# Patient Record
Sex: Female | Born: 1965 | Race: White | Hispanic: No | Marital: Married | Smoking: Never smoker
Health system: Southern US, Community
[De-identification: ages and names within clinical notes are randomized; demographics above are authoritative.]

## PROBLEM LIST (undated history)

## (undated) DIAGNOSIS — I10 Essential (primary) hypertension: Secondary | ICD-10-CM

## (undated) DIAGNOSIS — R112 Nausea with vomiting, unspecified: Secondary | ICD-10-CM

## (undated) DIAGNOSIS — E119 Type 2 diabetes mellitus without complications: Secondary | ICD-10-CM

## (undated) DIAGNOSIS — Z8719 Personal history of other diseases of the digestive system: Secondary | ICD-10-CM

## (undated) DIAGNOSIS — J45909 Unspecified asthma, uncomplicated: Secondary | ICD-10-CM

## (undated) DIAGNOSIS — Z9889 Other specified postprocedural states: Secondary | ICD-10-CM

## (undated) HISTORY — PX: CATARACT EXTRACTION, BILATERAL: SHX1313

## (undated) HISTORY — PX: ABDOMINAL HYSTERECTOMY: SHX81

## (undated) HISTORY — PX: CHOLECYSTECTOMY: SHX55

## (undated) HISTORY — PX: TONSILLECTOMY: SUR1361

---

## 2013-01-25 ENCOUNTER — Emergency Department: Payer: Self-pay | Admitting: Emergency Medicine

## 2013-01-25 LAB — MAGNESIUM: Magnesium: 1.6 mg/dL — ABNORMAL LOW

## 2013-01-25 LAB — BASIC METABOLIC PANEL
Anion Gap: 0 — ABNORMAL LOW (ref 7–16)
BUN: 14 mg/dL (ref 7–18)
CALCIUM: 8.5 mg/dL (ref 8.5–10.1)
Chloride: 105 mmol/L (ref 98–107)
Co2: 31 mmol/L (ref 21–32)
Creatinine: 0.93 mg/dL (ref 0.60–1.30)
EGFR (African American): >60
GLUCOSE: 256 mg/dL — AB (ref 65–99)
OSMOLALITY: 281 (ref 275–301)
Potassium: 4.1 mmol/L (ref 3.5–5.1)
SODIUM: 136 mmol/L (ref 136–145)

## 2013-01-25 LAB — URINALYSIS, COMPLETE
Bacteria: NONE SEEN
RBC,UR: 1 /HPF (ref 0–5)
SPECIFIC GRAVITY: 1.016 (ref 1.003–1.030)
WBC UR: 1 /HPF (ref 0–5)

## 2013-01-25 LAB — CBC
HCT: 38.8 % (ref 35.0–47.0)
HGB: 13.3 g/dL (ref 12.0–16.0)
MCH: 30.6 pg (ref 26.0–34.0)
MCHC: 34.3 g/dL (ref 32.0–36.0)
MCV: 89 fL (ref 80–100)
PLATELETS: 232 10*3/uL (ref 150–440)
RBC: 4.35 10*6/uL (ref 3.80–5.20)
RDW: 13.2 % (ref 11.5–14.5)
WBC: 8.8 10*3/uL (ref 3.6–11.0)

## 2013-01-25 LAB — PROTIME-INR
INR: 0.9
PROTHROMBIN TIME: 12 s (ref 11.5–14.7)

## 2013-01-25 LAB — PRO B NATRIURETIC PEPTIDE: B-Type Natriuretic Peptide: 339 pg/mL — ABNORMAL HIGH (ref 0–125)

## 2013-01-25 LAB — TROPONIN I

## 2013-05-19 DIAGNOSIS — I10 Essential (primary) hypertension: Secondary | ICD-10-CM | POA: Diagnosis present

## 2013-06-03 ENCOUNTER — Emergency Department: Payer: Self-pay | Admitting: Emergency Medicine

## 2013-06-03 LAB — COMPREHENSIVE METABOLIC PANEL
ALK PHOS: 125 U/L — AB
ANION GAP: 8 (ref 7–16)
Albumin: 3.6 g/dL (ref 3.4–5.0)
BILIRUBIN TOTAL: 0.3 mg/dL (ref 0.2–1.0)
BUN: 21 mg/dL — AB (ref 7–18)
Calcium, Total: 9.4 mg/dL (ref 8.5–10.1)
Chloride: 95 mmol/L — ABNORMAL LOW (ref 98–107)
Co2: 26 mmol/L (ref 21–32)
Creatinine: 1.25 mg/dL (ref 0.60–1.30)
EGFR (African American): 58 — ABNORMAL LOW
GFR CALC NON AF AMER: 50 — AB
Glucose: 453 mg/dL — ABNORMAL HIGH (ref 65–99)
OSMOLALITY: 282 (ref 275–301)
Potassium: 3.9 mmol/L (ref 3.5–5.1)
SGOT(AST): 32 U/L (ref 15–37)
SGPT (ALT): 32 U/L (ref 12–78)
SODIUM: 129 mmol/L — AB (ref 136–145)
Total Protein: 8.7 g/dL — ABNORMAL HIGH (ref 6.4–8.2)

## 2013-06-03 LAB — URINALYSIS, COMPLETE
Bacteria: NONE SEEN
Bilirubin,UR: NEGATIVE
Blood: NEGATIVE
KETONE: NEGATIVE
LEUKOCYTE ESTERASE: NEGATIVE
Nitrite: NEGATIVE
Ph: 5 (ref 4.5–8.0)
Protein: NEGATIVE
RBC,UR: <1 /HPF (ref 0–5)
SPECIFIC GRAVITY: 1.032 (ref 1.003–1.030)
Squamous Epithelial: <1

## 2013-06-03 LAB — CBC
HCT: 46.2 % (ref 35.0–47.0)
HGB: 15.6 g/dL (ref 12.0–16.0)
MCH: 30.9 pg (ref 26.0–34.0)
MCHC: 33.8 g/dL (ref 32.0–36.0)
MCV: 91 fL (ref 80–100)
Platelet: 231 10*3/uL (ref 150–440)
RBC: 5.06 10*6/uL (ref 3.80–5.20)
RDW: 12 % (ref 11.5–14.5)
WBC: 8.5 10*3/uL (ref 3.6–11.0)

## 2013-06-03 LAB — TROPONIN I

## 2013-06-16 DIAGNOSIS — E039 Hypothyroidism, unspecified: Secondary | ICD-10-CM | POA: Diagnosis present

## 2013-06-23 DIAGNOSIS — E785 Hyperlipidemia, unspecified: Secondary | ICD-10-CM | POA: Diagnosis present

## 2014-01-11 ENCOUNTER — Emergency Department: Payer: Self-pay | Admitting: Emergency Medicine

## 2014-01-11 LAB — COMPREHENSIVE METABOLIC PANEL
Albumin: 3.4 g/dL (ref 3.4–5.0)
Alkaline Phosphatase: 143 U/L — ABNORMAL HIGH
Anion Gap: 7 (ref 7–16)
BUN: 12 mg/dL (ref 7–18)
Bilirubin,Total: 0.4 mg/dL (ref 0.2–1.0)
CO2: 26 mmol/L (ref 21–32)
Calcium, Total: 8.7 mg/dL (ref 8.5–10.1)
Chloride: 104 mmol/L (ref 98–107)
Creatinine: 0.93 mg/dL (ref 0.60–1.30)
Glucose: 338 mg/dL — ABNORMAL HIGH (ref 65–99)
OSMOLALITY: 287 (ref 275–301)
Potassium: 4.3 mmol/L (ref 3.5–5.1)
SGOT(AST): 21 U/L (ref 15–37)
SGPT (ALT): 27 U/L
SODIUM: 137 mmol/L (ref 136–145)
Total Protein: 7.7 g/dL (ref 6.4–8.2)

## 2014-01-11 LAB — URINALYSIS, COMPLETE
BACTERIA: NONE SEEN
BILIRUBIN, UR: NEGATIVE
BLOOD: NEGATIVE
Ketone: NEGATIVE
LEUKOCYTE ESTERASE: NEGATIVE
Nitrite: NEGATIVE
Ph: 6 (ref 4.5–8.0)
Protein: NEGATIVE
RBC,UR: 1 /HPF (ref 0–5)
Specific Gravity: 1.029 (ref 1.003–1.030)
WBC UR: 6 /HPF (ref 0–5)

## 2014-01-11 LAB — TROPONIN I: Troponin-I: <0.02 ng/mL

## 2014-01-11 LAB — CBC
HCT: 45.1 % (ref 35.0–47.0)
HGB: 14.9 g/dL (ref 12.0–16.0)
MCH: 30.3 pg (ref 26.0–34.0)
MCHC: 33 g/dL (ref 32.0–36.0)
MCV: 92 fL (ref 80–100)
PLATELETS: 231 10*3/uL (ref 150–440)
RBC: 4.92 10*6/uL (ref 3.80–5.20)
RDW: 12.6 % (ref 11.5–14.5)
WBC: 7.6 10*3/uL (ref 3.6–11.0)

## 2014-01-11 LAB — CK TOTAL AND CKMB (NOT AT ARMC)
CK, Total: 87 U/L (ref 26–192)
CK-MB: 2 ng/mL (ref 0.5–3.6)

## 2014-09-22 ENCOUNTER — Emergency Department
Admission: EM | Admit: 2014-09-22 | Discharge: 2014-09-23 | Disposition: A | Discharge status: Home / Self Care | Payer: No Typology Code available for payment source | Attending: Emergency Medicine | Admitting: Emergency Medicine

## 2014-09-22 ENCOUNTER — Encounter: Payer: Self-pay | Admitting: Emergency Medicine

## 2014-09-22 DIAGNOSIS — R1032 Left lower quadrant pain: Secondary | ICD-10-CM | POA: Insufficient documentation

## 2014-09-22 DIAGNOSIS — I10 Essential (primary) hypertension: Secondary | ICD-10-CM | POA: Diagnosis not present

## 2014-09-22 DIAGNOSIS — E1165 Type 2 diabetes mellitus with hyperglycemia: Secondary | ICD-10-CM | POA: Insufficient documentation

## 2014-09-22 DIAGNOSIS — R739 Hyperglycemia, unspecified: Secondary | ICD-10-CM

## 2014-09-22 DIAGNOSIS — E860 Dehydration: Secondary | ICD-10-CM | POA: Insufficient documentation

## 2014-09-22 HISTORY — DX: Essential (primary) hypertension: I10

## 2014-09-22 HISTORY — DX: Type 2 diabetes mellitus without complications: E11.9

## 2014-09-22 LAB — COMPREHENSIVE METABOLIC PANEL
ALBUMIN: 3.9 g/dL (ref 3.5–5.0)
ALT: 20 U/L (ref 14–54)
AST: 23 U/L (ref 15–41)
Alkaline Phosphatase: 106 U/L (ref 38–126)
Anion gap: 9 (ref 5–15)
BILIRUBIN TOTAL: 0.6 mg/dL (ref 0.3–1.2)
BUN: 16 mg/dL (ref 6–20)
CHLORIDE: 99 mmol/L — AB (ref 101–111)
CO2: 27 mmol/L (ref 22–32)
Calcium: 8.8 mg/dL — ABNORMAL LOW (ref 8.9–10.3)
Creatinine, Ser: 1.08 mg/dL — ABNORMAL HIGH (ref 0.44–1.00)
GFR calc Af Amer: >60 mL/min (ref >60)
GFR calc non Af Amer: 58 mL/min — ABNORMAL LOW (ref >60)
GLUCOSE: 424 mg/dL — AB (ref 65–99)
POTASSIUM: 3.2 mmol/L — AB (ref 3.5–5.1)
Sodium: 135 mmol/L (ref 135–145)
Total Protein: 7.9 g/dL (ref 6.5–8.1)

## 2014-09-22 LAB — URINALYSIS COMPLETE WITH MICROSCOPIC (ARMC ONLY)
Bilirubin Urine: NEGATIVE
Glucose, UA: >500 mg/dL — AB
Hgb urine dipstick: NEGATIVE
Ketones, ur: NEGATIVE mg/dL
Leukocytes, UA: NEGATIVE
Nitrite: NEGATIVE
PH: 7 (ref 5.0–8.0)
PROTEIN: NEGATIVE mg/dL
Specific Gravity, Urine: 1.025 (ref 1.005–1.030)

## 2014-09-22 LAB — CBC
HEMATOCRIT: 40.9 % (ref 35.0–47.0)
Hemoglobin: 14.1 g/dL (ref 12.0–16.0)
MCH: 30.4 pg (ref 26.0–34.0)
MCHC: 34.4 g/dL (ref 32.0–36.0)
MCV: 88.4 fL (ref 80.0–100.0)
PLATELETS: 239 10*3/uL (ref 150–440)
RBC: 4.63 MIL/uL (ref 3.80–5.20)
RDW: 12.8 % (ref 11.5–14.5)
WBC: 8.3 10*3/uL (ref 3.6–11.0)

## 2014-09-22 MED ORDER — SODIUM CHLORIDE 0.9 % IV BOLUS (SEPSIS)
1000.0000 mL | Freq: Once | INTRAVENOUS | Status: AC
  Administered 2014-09-22: 1000 mL via INTRAVENOUS

## 2014-09-22 MED ORDER — ONDANSETRON HCL 4 MG/2ML IJ SOLN
4.0000 mg | Freq: Once | INTRAMUSCULAR | Status: AC
  Administered 2014-09-22: 4 mg via INTRAVENOUS
  Filled 2014-09-22: qty 2

## 2014-09-22 MED ORDER — FENTANYL CITRATE (PF) 100 MCG/2ML IJ SOLN
50.0000 ug | Freq: Once | INTRAMUSCULAR | Status: AC
  Administered 2014-09-22: 50 ug via INTRAVENOUS
  Filled 2014-09-22: qty 2

## 2014-09-22 NOTE — ED Notes (Signed)
CBG completed. Result 401.

## 2014-09-22 NOTE — ED Provider Notes (Signed)
[facility] Emergency Department Provider Note  ____________________________________________  Time seen: 11:30 PM  I have reviewed the triage vital signs and the nursing notes.   HISTORY  Chief Complaint Hyperglycemia      HPI Jennifer Lozano is a 49 y.o. female resents with hyperglycemia glucose reading greater than 600 at home this evening. Patient states following that result she took 34 units of NovoLog 70/30. On EMS arrival of patient's glucose was 406. Patient states states only recent illness" gastroenteritis which she was seen at [facility]. Patient admits to left lower quadrant abdominal pain no vomiting or diarrhea at this time. Current pain score 8 out of 10. Patient's husband stated that she was recently prescribed a "steroid medication" however he cannot recall the name.    Past Medical History  Diagnosis Date  . Diabetes mellitus without complication   . Hypertension     There are no active problems to display for this patient.   Past Surgical History  Procedure Laterality Date  . Abdominal hysterectomy    . Cholecystectomy    . Tonsillectomy      No current outpatient prescriptions on file.  Allergies Morphine and related  Family History  Problem Relation Age of Onset  . Diabetes Sister   . Diabetes Brother     Social History Social History  Substance Use Topics  . Smoking status: Never Smoker   . Smokeless tobacco: None  . Alcohol Use: No    Review of Systems  Constitutional: Negative for fever. Eyes: Negative for visual changes. ENT: Negative for sore throat. Cardiovascular: Negative for chest pain. Respiratory: Negative for shortness of breath. Gastrointestinal: Positive for abdominal pain, negative for vomiting and diarrhea. Genitourinary: Negative for dysuria. Musculoskeletal: Negative for back pain. Skin: Negative for rash. Neurological: Negative for headaches, focal weakness or numbness.   10-point  ROS otherwise negative.  ____________________________________________   PHYSICAL EXAM:  VITAL SIGNS: ED Triage Vitals  Enc Vitals Group     BP 09/22/14 2309 160/76 mmHg     Pulse Rate 09/22/14 2309 76     Resp 09/22/14 2309 18     Temp 09/22/14 2309 98.6 F (37 C)     Temp Source 09/22/14 2309 Oral     SpO2 --      Weight 09/22/14 2309 196 lb (88.905 kg)     Height 09/22/14 2309 5\' 2"  (1.575 m)     Head Cir --      Peak Flow --      Pain Score 09/22/14 2306 8     Pain Loc --      Pain Edu? --      Excl. in GC? --     Constitutional: Alert and oriented. Well appearing and in no distress. Eyes: Conjunctivae are normal. PERRL. Normal extraocular movements. ENT   Head: Normocephalic and atraumatic.   Nose: No congestion/rhinnorhea.   Mouth/Throat: Mucous membranes are moist.   Neck: No stridor. Hematological/Lymphatic/Immunilogical: No cervical lymphadenopathy. Cardiovascular: Normal rate, regular rhythm. Normal and symmetric distal pulses are present in all extremities. No murmurs, rubs, or gallops. Respiratory: Normal respiratory effort without tachypnea nor retractions. Breath sounds are clear and equal bilaterally. No wheezes/rales/rhonchi. Gastrointestinal: Positive left lower quadrant abdominal pain to palpation. No distention. There is no CVA tenderness. Genitourinary: deferred Musculoskeletal: Nontender with normal range of motion in all extremities. No joint effusions.  No lower extremity tenderness nor edema. Neurologic:  Normal speech and language. No gross focal neurologic deficits are appreciated. Speech  is normal.  Skin:  Skin is warm, dry and intact. No rash noted. Psychiatric: Mood and affect are normal. Speech and behavior are normal. Patient exhibits appropriate insight and judgment.  ____________________________________________    LABS (pertinent positives/negatives)  Labs Reviewed  COMPREHENSIVE METABOLIC PANEL - Abnormal; Notable for the  following:    Potassium 3.2 (*)    Chloride 99 (*)    Glucose, Bld 424 (*)    Creatinine, Ser 1.08 (*)    Calcium 8.8 (*)    GFR calc non Af Amer 58 (*)    All other components within normal limits  CBC  URINALYSIS COMPLETEWITH MICROSCOPIC ([facility] ONLY)      RADIOLOGY  CT Abdomen Pelvis W Contrast (Final result) Result time: 09/23/14 01:41:05   Final result by Rad Results In Interface (09/23/14 01:41:05)   Narrative:   CLINICAL DATA: Left lower quadrant pain for 2 weeks. Nausea and vomiting.  EXAM: CT ABDOMEN AND PELVIS WITH CONTRAST  TECHNIQUE: Multidetector CT imaging of the abdomen and pelvis was performed using the standard protocol following bolus administration of intravenous contrast.  CONTRAST: OMNIPAQUE IOHEXOL 300 MG/ML SOLN  COMPARISON: None.  FINDINGS: Lower chest: The included lung bases are clear.  Liver: No focal lesion.  Hepatobiliary: Clips in the gallbladder fossa postcholecystectomy. No biliary dilatation.  Pancreas: Normal.  Spleen: Normal.  Adrenal glands: No nodule.  Kidneys: Symmetric renal enhancement and excretion. No hydronephrosis. No focal renal abnormality.  Stomach/Bowel: Stomach is distended with enteric contrast. There are no dilated or thickened small bowel loops. Small volume of stool throughout the colon without colonic wall thickening. No significant diverticular disease or diverticulitis. The appendix is normal.  Vascular/Lymphatic: No retroperitoneal adenopathy. Abdominal aorta is normal in caliber. Mild atherosclerosis of the abdominal aorta and its branches without aneurysm.  Reproductive: The uterus is surgically absent. No adnexal mass.  Bladder: Near completely decompressed.  Other: No free air, free fluid, or intra-abdominal fluid collection.  Musculoskeletal: There are no acute or suspicious osseous abnormalities.  IMPRESSION: No acute abnormality in the abdomen/pelvis. No diverticulosis  or diverticulitis.   Electronically Signed By: Rubye Oaks M.D. On: 09/23/2014 01:41         INITIAL IMPRESSION / ASSESSMENT AND PLAN / ED COURSE  Pertinent labs & imaging results that were available during my care of the patient were reviewed by me and considered in my medical decision making (see chart for details).  History of physical exam consistent with dehydration and hyperglycemia. Patient's hyperglycemia most likely secondary just recent oral steroid use. Considered possibly of infection however no foci was found. ____________________________________________   FINAL CLINICAL IMPRESSION(S) / ED DIAGNOSES  Final diagnoses:  Hyperglycemia  Dehydration      Darci Current, MD 09/23/14 [id number]

## 2014-09-22 NOTE — ED Notes (Signed)
EMS states that pt checked her BS and noted it to be over 600. Pt took 34 units of Novolog 70/30. EMS states when they arrived they checked it twice and last check was 406. Pt states she just feels weak.

## 2014-09-23 ENCOUNTER — Emergency Department: Payer: No Typology Code available for payment source

## 2014-09-23 ENCOUNTER — Encounter: Payer: Self-pay | Admitting: Radiology

## 2014-09-23 LAB — GLUCOSE, CAPILLARY
GLUCOSE-CAPILLARY: 401 mg/dL — AB (ref 65–99)
GLUCOSE-CAPILLARY: 226 mg/dL — AB (ref 65–99)
Glucose-Capillary: 242 mg/dL — ABNORMAL HIGH (ref 65–99)

## 2014-09-23 MED ORDER — IOHEXOL 300 MG/ML  SOLN
100.0000 mL | Freq: Once | INTRAMUSCULAR | Status: AC | PRN
  Administered 2014-09-23: 100 mL via INTRAVENOUS

## 2014-09-23 MED ORDER — IOHEXOL 240 MG/ML SOLN
25.0000 mL | Freq: Once | INTRAMUSCULAR | Status: AC | PRN
  Administered 2014-09-23: 25 mL via ORAL

## 2014-09-23 NOTE — Discharge Instructions (Signed)
Dehydration, Adult Dehydration is when you lose more fluids from the body than you take in. Vital organs like the kidneys, brain, and heart cannot function without a proper amount of fluids and salt. Any loss of fluids from the body can cause dehydration.  CAUSES   Vomiting.  Diarrhea.  Excessive sweating.  Excessive urine output.  Fever. SYMPTOMS  Mild dehydration  Thirst.  Dry lips.  Slightly dry mouth. Moderate dehydration  Very dry mouth.  Sunken eyes.  Skin does not bounce back quickly when lightly pinched and released.  Dark urine and decreased urine production.  Decreased tear production.  Headache. Severe dehydration  Very dry mouth.  Extreme thirst.  Rapid, weak pulse (more than 100 beats per minute at rest).  Cold hands and feet.  Not able to sweat in spite of heat and temperature.  Rapid breathing.  Blue lips.  Confusion and lethargy.  Difficulty being awakened.  Minimal urine production.  No tears. DIAGNOSIS  Your caregiver will diagnose dehydration based on your symptoms and your exam. Blood and urine tests will help confirm the diagnosis. The diagnostic evaluation should also identify the cause of dehydration. TREATMENT  Treatment of mild or moderate dehydration can often be done at home by increasing the amount of fluids that you drink. It is best to drink small amounts of fluid more often. Drinking too much at one time can make vomiting worse. Refer to the home care instructions below. Severe dehydration needs to be treated at the hospital where you will probably be given intravenous (IV) fluids that contain water and electrolytes. HOME CARE INSTRUCTIONS   Ask your caregiver about specific rehydration instructions.  Drink enough fluids to keep your urine clear or pale yellow.  Drink small amounts frequently if you have nausea and vomiting.  Eat as you normally do.  Avoid:  Foods or drinks high in sugar.  Carbonated  drinks.  Juice.  Extremely hot or cold fluids.  Drinks with caffeine.  Fatty, greasy foods.  Alcohol.  Tobacco.  Overeating.  Gelatin desserts.  Wash your hands well to avoid spreading bacteria and viruses.  Only take over-the-counter or prescription medicines for pain, discomfort, or fever as directed by your caregiver.  Ask your caregiver if you should continue all prescribed and over-the-counter medicines.  Keep all follow-up appointments with your caregiver. SEEK MEDICAL CARE IF:  You have abdominal pain and it increases or stays in one area (localizes).  You have a rash, stiff neck, or severe headache.  You are irritable, sleepy, or difficult to awaken.  You are weak, dizzy, or extremely thirsty. SEEK IMMEDIATE MEDICAL CARE IF:   You are unable to keep fluids down or you get worse despite treatment.  You have frequent episodes of vomiting or diarrhea.  You have blood or green matter (bile) in your vomit.  You have blood in your stool or your stool looks black and tarry.  You have not urinated in 6 to 8 hours, or you have only urinated a small amount of very dark urine.  You have a fever.  You faint. MAKE SURE YOU:   Understand these instructions.  Will watch your condition.  Will get help right away if you are not doing well or get worse. Document Released: 12/25/2004 Document Revised: 03/19/2011 Document Reviewed: 08/14/2010 Exit[employer] Patient Information 2015 [employer]. This information is not intended to replace advice given to you by your health care provider. Make sure you discuss any questions you have with your health care   provider.   Hyperglycemia Hyperglycemia occurs when the glucose (sugar) in your blood is too high. Hyperglycemia can happen for many reasons, but it most often happens to people who do not know they have diabetes or are not managing their diabetes properly.  CAUSES  Whether you have diabetes or not, there are other  causes of hyperglycemia. Hyperglycemia can occur when you have diabetes, but it can also occur in other situations that you might not be as aware of, such as: Diabetes  If you have diabetes and are having problems controlling your blood glucose, hyperglycemia could occur because of some of the following reasons:  Not following your meal plan.  Not taking your diabetes medications or not taking it properly.  Exercising less or doing less activity than you normally do.  Being sick. Pre-diabetes  This cannot be ignored. Before people develop Type 2 diabetes, they almost always have "pre-diabetes." This is when your blood glucose levels are higher than normal, but not yet high enough to be diagnosed as diabetes. Research has shown that some long-term damage to the body, especially the heart and circulatory system, may already be occurring during pre-diabetes. If you take action to manage your blood glucose when you have pre-diabetes, you may delay or prevent Type 2 diabetes from developing. Stress  If you have diabetes, you may be "diet" controlled or on oral medications or insulin to control your diabetes. However, you may find that your blood glucose is higher than usual in the hospital whether you have diabetes or not. This is often referred to as "stress hyperglycemia." Stress can elevate your blood glucose. This happens because of hormones put out by the body during times of stress. If stress has been the cause of your high blood glucose, it can be followed regularly by your caregiver. That way he/she can make sure your hyperglycemia does not continue to get worse or progress to diabetes. Steroids  Steroids are medications that act on the infection fighting system (immune system) to block inflammation or infection. One side effect can be a rise in blood glucose. Most people can produce enough extra insulin to allow for this rise, but for those who cannot, steroids make blood glucose levels go  even higher. It is not unusual for steroid treatments to "uncover" diabetes that is developing. It is not always possible to determine if the hyperglycemia will go away after the steroids are stopped. A special blood test called an A1c is sometimes done to determine if your blood glucose was elevated before the steroids were started. SYMPTOMS  Thirsty.  Frequent urination.  Dry mouth.  Blurred vision.  Tired or fatigue.  Weakness.  Sleepy.  Tingling in feet or leg. DIAGNOSIS  Diagnosis is made by monitoring blood glucose in one or all of the following ways:  A1c test. This is a chemical found in your blood.  Fingerstick blood glucose monitoring.  Laboratory results. TREATMENT  First, knowing the cause of the hyperglycemia is important before the hyperglycemia can be treated. Treatment may include, but is not be limited to:  Education.  Change or adjustment in medications.  Change or adjustment in meal plan.  Treatment for an illness, infection, etc.  More frequent blood glucose monitoring.  Change in exercise plan.  Decreasing or stopping steroids.  Lifestyle changes. HOME CARE INSTRUCTIONS   Test your blood glucose as directed.  Exercise regularly. Your caregiver will give you instructions about exercise. Pre-diabetes or diabetes which comes on with stress is helped by   exercising.  Eat wholesome, balanced meals. Eat often and at regular, fixed times. Your caregiver or nutritionist will give you a meal plan to guide your sugar intake.  Being at an ideal weight is important. If needed, losing as little as 10 to 15 pounds may help improve blood glucose levels. SEEK MEDICAL CARE IF:   You have questions about medicine, activity, or diet.  You continue to have symptoms (problems such as increased thirst, urination, or weight gain). SEEK IMMEDIATE MEDICAL CARE IF:   You are vomiting or have diarrhea.  Your breath smells fruity.  You are breathing faster or  slower.  You are very sleepy or incoherent.  You have numbness, tingling, or pain in your feet or hands.  You have chest pain.  Your symptoms get worse even though you have been following your caregiver's orders.  If you have any other questions or concerns. Document Released: 06/20/2000 Document Revised: 03/19/2011 Document Reviewed: 04/23/2011 ExitCare Patient Information 2015 [employer]. This information is not intended to replace advice given to you by your health care provider. Make sure you discuss any questions you have with your health care provider.  

## 2015-01-17 ENCOUNTER — Emergency Department
Admission: EM | Admit: 2015-01-17 | Discharge: 2015-01-17 | Disposition: A | Discharge status: Home / Self Care | Payer: BLUE CROSS/BLUE SHIELD | Attending: Emergency Medicine | Admitting: Emergency Medicine

## 2015-01-17 ENCOUNTER — Emergency Department: Payer: BLUE CROSS/BLUE SHIELD

## 2015-01-17 ENCOUNTER — Encounter: Payer: Self-pay | Admitting: Emergency Medicine

## 2015-01-17 DIAGNOSIS — K219 Gastro-esophageal reflux disease without esophagitis: Secondary | ICD-10-CM | POA: Insufficient documentation

## 2015-01-17 DIAGNOSIS — I10 Essential (primary) hypertension: Secondary | ICD-10-CM | POA: Insufficient documentation

## 2015-01-17 DIAGNOSIS — E119 Type 2 diabetes mellitus without complications: Secondary | ICD-10-CM | POA: Insufficient documentation

## 2015-01-17 DIAGNOSIS — R109 Unspecified abdominal pain: Secondary | ICD-10-CM | POA: Diagnosis present

## 2015-01-17 LAB — CBC WITH DIFFERENTIAL/PLATELET
BASOS ABS: 0 10*3/uL (ref 0–0.1)
BASOS PCT: 1 %
EOS PCT: 2 %
Eosinophils Absolute: 0.2 10*3/uL (ref 0–0.7)
HCT: 46.8 % (ref 35.0–47.0)
Hemoglobin: 15.7 g/dL (ref 12.0–16.0)
Lymphocytes Relative: 30 %
Lymphs Abs: 2.6 10*3/uL (ref 1.0–3.6)
MCH: 29.2 pg (ref 26.0–34.0)
MCHC: 33.6 g/dL (ref 32.0–36.0)
MCV: 86.9 fL (ref 80.0–100.0)
MONO ABS: 0.7 10*3/uL (ref 0.2–0.9)
Monocytes Relative: 8 %
NEUTROS ABS: 5.3 10*3/uL (ref 1.4–6.5)
Neutrophils Relative %: 59 %
PLATELETS: 242 10*3/uL (ref 150–440)
RBC: 5.39 MIL/uL — ABNORMAL HIGH (ref 3.80–5.20)
RDW: 13.6 % (ref 11.5–14.5)
WBC: 8.7 10*3/uL (ref 3.6–11.0)

## 2015-01-17 LAB — COMPREHENSIVE METABOLIC PANEL
ALT: 21 U/L (ref 14–54)
AST: 20 U/L (ref 15–41)
Albumin: 4.1 g/dL (ref 3.5–5.0)
Alkaline Phosphatase: 95 U/L (ref 38–126)
Anion gap: 9 (ref 5–15)
BUN: 15 mg/dL (ref 6–20)
CO2: 28 mmol/L (ref 22–32)
Calcium: 9.2 mg/dL (ref 8.9–10.3)
Chloride: 102 mmol/L (ref 101–111)
Creatinine, Ser: 1.02 mg/dL — ABNORMAL HIGH (ref 0.44–1.00)
GFR calc Af Amer: >60 mL/min (ref >60)
GFR calc non Af Amer: >60 mL/min (ref >60)
Glucose, Bld: 267 mg/dL — ABNORMAL HIGH (ref 65–99)
Potassium: 3.9 mmol/L (ref 3.5–5.1)
Sodium: 139 mmol/L (ref 135–145)
Total Bilirubin: 1.1 mg/dL (ref 0.3–1.2)
Total Protein: 8.4 g/dL — ABNORMAL HIGH (ref 6.5–8.1)

## 2015-01-17 LAB — LIPASE, BLOOD: Lipase: 39 U/L (ref 11–51)

## 2015-01-17 LAB — GLUCOSE, CAPILLARY: GLUCOSE-CAPILLARY: 243 mg/dL — AB (ref 65–99)

## 2015-01-17 MED ORDER — FAMOTIDINE 20 MG PO TABS
20.0000 mg | ORAL_TABLET | Freq: Once | ORAL | Status: AC
  Administered 2015-01-17: 20 mg via ORAL
  Filled 2015-01-17: qty 1

## 2015-01-17 MED ORDER — GI COCKTAIL ~~LOC~~
30.0000 mL | Freq: Once | ORAL | Status: AC
  Administered 2015-01-17: 30 mL via ORAL
  Filled 2015-01-17: qty 30

## 2015-01-17 MED ORDER — ONDANSETRON HCL 4 MG/2ML IJ SOLN
4.0000 mg | Freq: Once | INTRAMUSCULAR | Status: AC
  Administered 2015-01-17: 4 mg via INTRAVENOUS
  Filled 2015-01-17: qty 2

## 2015-01-17 MED ORDER — HYDROMORPHONE HCL 1 MG/ML IJ SOLN
1.0000 mg | Freq: Once | INTRAMUSCULAR | Status: AC
  Administered 2015-01-17: 1 mg via INTRAVENOUS
  Filled 2015-01-17: qty 1

## 2015-01-17 MED ORDER — SODIUM CHLORIDE 0.9 % IV SOLN
1000.0000 mL | Freq: Once | INTRAVENOUS | Status: AC
  Administered 2015-01-17: 1000 mL via INTRAVENOUS

## 2015-01-17 MED ORDER — FAMOTIDINE 20 MG PO TABS: 20.0000 mg | ORAL_TABLET | Freq: Every day | ORAL | Status: DC

## 2015-01-17 MED ORDER — SUCRALFATE 1 G PO TABS: 1.0000 g | ORAL_TABLET | Freq: Four times a day (QID) | ORAL | Status: DC

## 2015-01-17 NOTE — ED Provider Notes (Addendum)
[facility] Emergency Department Provider Note     Time seen: ----------------------------------------- 9:58 AM on 01/17/2015 -----------------------------------------    I have reviewed the triage vital signs and the nursing notes.   HISTORY  Chief Complaint Abdominal Pain    HPI Jennifer Lozano is a 50 y.o. female who presents ER for right side pain that is worse after eating. She has a history of pancreatitis in the past and states feels similar.She has had nausea associated with this pain for 2 days, was concerned she may have pancreatitis again. Pain seems to worse after eating, states her blood sugar has fluctuated.   Past Medical History  Diagnosis Date  . Diabetes mellitus without complication (HCC)   . Hypertension     There are no active problems to display for this patient.   Past Surgical History  Procedure Laterality Date  . Abdominal hysterectomy    . Cholecystectomy    . Tonsillectomy      Allergies Morphine and related  Social History Social History  Substance Use Topics  . Smoking status: Never Smoker   . Smokeless tobacco: None  . Alcohol Use: No    Review of Systems Constitutional: Negative for fever. Eyes: Negative for visual changes. ENT: Negative for sore throat. Cardiovascular: Negative for chest pain. Respiratory: Negative for shortness of breath. Gastrointestinal: Positive for abdominal pain and nausea Genitourinary: Negative for dysuria. Musculoskeletal: Negative for back pain. Skin: Negative for rash. Neurological: Negative for headaches, focal weakness or numbness.  10-point ROS otherwise negative.  ____________________________________________   PHYSICAL EXAM:  VITAL SIGNS: ED Triage Vitals  Enc Vitals Group     BP 01/17/15 0947 159/88 mmHg     Pulse Rate 01/17/15 0947 78     Resp --      Temp 01/17/15 0947 98.3 F (36.8 C)     Temp Source 01/17/15 0947 Oral     SpO2 01/17/15 0947 98 %     Weight 01/17/15 0947 199 lb (90.266 kg)     Height 01/17/15 0947 5\' 3"  (1.6 m)     Head Cir --      Peak Flow --      Pain Score 01/17/15 0948 8     Pain Loc --      Pain Edu? --      Excl. in GC? --     Constitutional: Alert and oriented. Well appearing and in no distress. Eyes: Conjunctivae are normal. PERRL. Normal extraocular movements. ENT   Head: Normocephalic and atraumatic.   Nose: No congestion/rhinnorhea.   Mouth/Throat: Mucous membranes are moist.   Neck: No stridor. Cardiovascular: Normal rate, regular rhythm. Normal and symmetric distal pulses are present in all extremities. No murmurs, rubs, or gallops. Respiratory: Normal respiratory effort without tachypnea nor retractions. Breath sounds are clear and equal bilaterally. No wheezes/rales/rhonchi. Gastrointestinal: Mild left upper quadrant tenderness, no rebound or guarding. Normal bowel sounds. Musculoskeletal: Nontender with normal range of motion in all extremities. No joint effusions.  No lower extremity tenderness nor edema. Neurologic:  Normal speech and language. No gross focal neurologic deficits are appreciated. Speech is normal. No gait instability. Skin:  Skin is warm, dry and intact. No rash noted. Psychiatric: Mood and affect are normal. Speech and behavior are normal. Patient exhibits appropriate insight and judgment.  ____________________________________________  ED COURSE:  Pertinent labs & imaging results that were available during my care of the patient were reviewed by me and considered in my medical decision making (see chart for  details). Patient is in no acute distress, will check basic abdominal labs and imaging. ____________________________________________    LABS (pertinent positives/negatives)  Labs Reviewed  GLUCOSE, CAPILLARY - Abnormal; Notable for the following:    Glucose-Capillary 243 (*)    All other components within normal limits  CBC WITH DIFFERENTIAL/PLATELET -  Abnormal; Notable for the following:    RBC 5.39 (*)    All other components within normal limits  COMPREHENSIVE METABOLIC PANEL - Abnormal; Notable for the following:    Glucose, Bld 267 (*)    Creatinine, Ser 1.02 (*)    Total Protein 8.4 (*)    All other components within normal limits  LIPASE, BLOOD  URINALYSIS COMPLETEWITH MICROSCOPIC ([facility] ONLY)    RADIOLOGY  IMPRESSION: 1. Unremarkable bowel gas pattern aside from a mild paucity of gas in the small bowel which may be incidental. A cause for the patient's left-sided abdominal pain is not observed. ____________________________________________  FINAL ASSESSMENT AND PLAN  Gastroesophageal reflux disease  Plan: Patient with labs and imaging as dictated above. Patient symptoms are likely GERD related. Should be placed on an acids, Carafate and encouraged to have close follow-up with her doctor for reevaluation.   Emily FilbertWilliams, Katee Wentland E, MD   Emily FilbertJonathan E Rakisha Pincock, MD 01/17/15 1240  Emily FilbertJonathan E Lajuan Kovaleski, MD 01/17/15 1250

## 2015-01-17 NOTE — ED Notes (Signed)
Pt reports upper abd pain and nausea x 2 days.  States it feels like previous issues with pancreatitis.  Skin w/d with good color.  BS present, abd soft.

## 2015-01-17 NOTE — ED Notes (Signed)
Pain 3/10, much improved

## 2015-01-17 NOTE — Discharge Instructions (Signed)

## 2015-01-17 NOTE — ED Notes (Signed)
Pt to triage for right side abd pain. Pain worse after eating. Pt with hx pancreatitis x 2 and says this feels similar.

## 2015-07-11 ENCOUNTER — Encounter: Payer: Self-pay | Admitting: Emergency Medicine

## 2015-07-11 ENCOUNTER — Emergency Department: Payer: BLUE CROSS/BLUE SHIELD

## 2015-07-11 ENCOUNTER — Emergency Department
Admission: EM | Admit: 2015-07-11 | Discharge: 2015-07-11 | Disposition: A | Discharge status: Home / Self Care | Payer: BLUE CROSS/BLUE SHIELD | Attending: Emergency Medicine | Admitting: Emergency Medicine

## 2015-07-11 DIAGNOSIS — Z794 Long term (current) use of insulin: Secondary | ICD-10-CM | POA: Diagnosis not present

## 2015-07-11 DIAGNOSIS — M79604 Pain in right leg: Secondary | ICD-10-CM | POA: Insufficient documentation

## 2015-07-11 DIAGNOSIS — I10 Essential (primary) hypertension: Secondary | ICD-10-CM | POA: Diagnosis not present

## 2015-07-11 DIAGNOSIS — E119 Type 2 diabetes mellitus without complications: Secondary | ICD-10-CM | POA: Insufficient documentation

## 2015-07-11 DIAGNOSIS — Z79899 Other long term (current) drug therapy: Secondary | ICD-10-CM | POA: Diagnosis not present

## 2015-07-11 DIAGNOSIS — Z7984 Long term (current) use of oral hypoglycemic drugs: Secondary | ICD-10-CM | POA: Insufficient documentation

## 2015-07-11 LAB — CBC
HEMATOCRIT: 43.5 % (ref 35.0–47.0)
Hemoglobin: 15.1 g/dL (ref 12.0–16.0)
MCH: 30.9 pg (ref 26.0–34.0)
MCHC: 34.8 g/dL (ref 32.0–36.0)
MCV: 89 fL (ref 80.0–100.0)
Platelets: 252 10*3/uL (ref 150–440)
RBC: 4.89 MIL/uL (ref 3.80–5.20)
RDW: 13.1 % (ref 11.5–14.5)
WBC: 11.4 10*3/uL — AB (ref 3.6–11.0)

## 2015-07-11 LAB — COMPREHENSIVE METABOLIC PANEL
ALBUMIN: 4 g/dL (ref 3.5–5.0)
ALT: 22 U/L (ref 14–54)
AST: 22 U/L (ref 15–41)
Alkaline Phosphatase: 82 U/L (ref 38–126)
Anion gap: 9 (ref 5–15)
BUN: 29 mg/dL — AB (ref 6–20)
CHLORIDE: 99 mmol/L — AB (ref 101–111)
CO2: 27 mmol/L (ref 22–32)
Calcium: 9.4 mg/dL (ref 8.9–10.3)
Creatinine, Ser: 1.09 mg/dL — ABNORMAL HIGH (ref 0.44–1.00)
GFR calc Af Amer: >60 mL/min (ref >60)
GFR, EST NON AFRICAN AMERICAN: 57 mL/min — AB (ref >60)
Glucose, Bld: 255 mg/dL — ABNORMAL HIGH (ref 65–99)
POTASSIUM: 4 mmol/L (ref 3.5–5.1)
Sodium: 135 mmol/L (ref 135–145)
Total Bilirubin: 0.7 mg/dL (ref 0.3–1.2)
Total Protein: 8.1 g/dL (ref 6.5–8.1)

## 2015-07-11 MED ORDER — KETOROLAC TROMETHAMINE 30 MG/ML IJ SOLN
INTRAMUSCULAR | Status: AC
  Filled 2015-07-11: qty 1

## 2015-07-11 MED ORDER — KETOROLAC TROMETHAMINE 30 MG/ML IJ SOLN: 30.0000 mg | Freq: Once | INTRAMUSCULAR | Status: DC

## 2015-07-11 MED ORDER — KETOROLAC TROMETHAMINE 30 MG/ML IJ SOLN
30.0000 mg | Freq: Once | INTRAMUSCULAR | Status: AC
  Administered 2015-07-11: 30 mg via INTRAMUSCULAR

## 2015-07-11 NOTE — ED Provider Notes (Signed)
[facility] Emergency Department Provider Note  ____________________________________________    I have reviewed the triage vital signs and the nursing notes.   HISTORY  Chief Complaint Leg Pain    HPI Jennifer Lozano is a 50 y.o. female who presents with complaints of right thigh pain for approximately 1.5 months. She reports seems to have worsened over the last 2 weeks. The pain stretches from her groin to her knee, early on the medial aspect and is moderate to severe at times and worse with standing. [facility] and [facility] and says "they didn't do anything for me". She was apparently provided pain medications but reports little improvement in discomfort. She denies fevers chills. No trauma to the area. No back pain. No history of blood clots or recent travel. She has a history of diabetes which she reports is somewhat poorly controlled     Past Medical History  Diagnosis Date  . Diabetes mellitus without complication (HCC)   . Hypertension     There are no active problems to display for this patient.   Past Surgical History  Procedure Laterality Date  . Abdominal hysterectomy    . Cholecystectomy    . Tonsillectomy      Current Outpatient Rx  Name  Route  Sig  Dispense  Refill  . famotidine (PEPCID) 20 MG tablet   Oral   Take 1 tablet (20 mg total) by mouth daily.   30 tablet   1   . insulin aspart protamine - aspart (NOVOLOG 70/30 MIX) (70-30) 100 UNIT/ML FlexPen   Subcutaneous   Inject 35-36 Units into the skin 2 (two) times daily with a meal. 36 units every morning and 35 units every evening.         Marland Kitchen. lisinopril-hydrochlorothiazide (PRINZIDE,ZESTORETIC) 20-12.5 MG tablet   Oral   Take 1 tablet by mouth daily.         . sitaGLIPtin (JANUVIA) 100 MG tablet   Oral   Take 100 mg by mouth daily.         . sucralfate (CARAFATE) 1 g tablet   Oral   Take 1 tablet (1 g total) by  mouth 4 (four) times daily.   120 tablet   1     Allergies Morphine and related  Family History  Problem Relation Age of Onset  . Diabetes Sister   . Diabetes Brother     Social History Social History  Substance Use Topics  . Smoking status: Never Smoker   . Smokeless tobacco: None  . Alcohol Use: No    Review of Systems  Constitutional: Negative for fever.  ENT: Negative for sore throat Cardiovascular: Negative for chest pain Respiratory: Negative for shortness of breath.No pleurisy Gastrointestinal: Negative for abdominal pain Genitourinary: Negative for dysuria. Musculoskeletal: Right thigh pain as above Skin: Negative for rash. Neurological: Negative for focal weakness Psychiatric: no anxiety    ____________________________________________   PHYSICAL EXAM:  VITAL SIGNS: ED Triage Vitals  Enc Vitals Group     BP 07/11/15 1551 127/88 mmHg     Pulse Rate 07/11/15 1551 105     Resp 07/11/15 1551 18     Temp 07/11/15 1551 98.8 F (37.1 C)     Temp Source 07/11/15 1551 Oral     SpO2 07/11/15 1551 98 %     Weight 07/11/15 1551 215 lb (97.523 kg)     Height 07/11/15 1551 5\' 3"  (1.6 m)  Head Cir --      Peak Flow --      Pain Score 07/11/15 1544 10     Pain Loc --      Pain Edu? --      Excl. in GC? --      Constitutional: Alert and oriented. Well appearing and in no distress.  Eyes: Conjunctivae are normal. No erythema or injection ENT   Head: Normocephalic and atraumatic.   Mouth/Throat: Mucous membranes are moist. Cardiovascular: Normal rate, regular rhythm. Normal and symmetric distal pulses are present in the Lower extremities, feet are warm and well perfused Respiratory: Normal respiratory effort without tachypnea nor retractions.  Gastrointestinal: Soft and non-tender in all quadrants. No distention.  Genitourinary: deferred Musculoskeletal: Nontender with normal range of motion in all extremities. Patient has mild tenderness  particularly medially to the right anterior thigh, no swelling bruising or erythema noted. Compartments are soft. No calf swelling or edema. No pain with axial load on right hip, full range of motion of right leg, passively Neurologic:  Normal speech and language. No gross focal neurologic deficits are appreciated. Skin:  Skin is warm, dry and intact. No rash noted. Psychiatric: Mood and affect are normal. Patient exhibits appropriate insight and judgment.  ____________________________________________    LABS (pertinent positives/negatives)  Labs Reviewed  COMPREHENSIVE METABOLIC PANEL - Abnormal; Notable for the following:    Chloride 99 (*)    Glucose, Bld 255 (*)    BUN 29 (*)    Creatinine, Ser 1.09 (*)    GFR calc non Af Amer 57 (*)    All other components within normal limits  CBC - Abnormal; Notable for the following:    WBC 11.4 (*)    All other components within normal limits    ____________________________________________   EKG  None ____________________________________________    RADIOLOGY  Ultrasound shows no DVT  ____________________________________________   PROCEDURES  Procedure(s) performed: none  Critical Care performed: none  ____________________________________________   INITIAL IMPRESSION / ASSESSMENT AND PLAN / ED COURSE  Pertinent labs & imaging results that were available during my care of the patient were reviewed by me and considered in my medical decision making (see chart for details).  Patient overall well-appearing with no acute distress. We will obtain ultrasound to rule out DVT given continued thigh pain.  Ultrasound is unremarkable. Patient is ambulating well without significant difficulty. Recommended outpatient follow-up for further evaluation. Heart rate normal prior to discharge  ____________________________________________   FINAL CLINICAL IMPRESSION(S) / ED DIAGNOSES  Final diagnoses:  Musculoskeletal leg pain, right           Jene Everyobert Jaxson Anglin, MD 07/11/15 2227

## 2015-07-11 NOTE — ED Notes (Signed)
Pt complains of right upper leg pain from knee to hip, pt reports being seen three times in the last month for this problem, pt is able to walk, pt ambulated from wheelchair to bed, pt denies any other symptoms

## 2015-07-11 NOTE — ED Notes (Signed)
Pt to ed with c/o right upper leg pain, states pain starts at knee and then radiates up to thigh area.  Pt reports unable to stand, increased pain with movement or ambulation.  [facility].  [facility].  Has tried oxycontin and hydrocodone without relief.

## 2015-07-11 NOTE — Discharge Instructions (Signed)

## 2015-12-21 ENCOUNTER — Encounter (INDEPENDENT_AMBULATORY_CARE_PROVIDER_SITE_OTHER): Payer: Self-pay | Admitting: Ophthalmology

## 2016-03-31 ENCOUNTER — Emergency Department
Admission: EM | Admit: 2016-03-31 | Discharge: 2016-03-31 | Disposition: A | Discharge status: Home / Self Care | Payer: BLUE CROSS/BLUE SHIELD | Attending: Emergency Medicine | Admitting: Emergency Medicine

## 2016-03-31 ENCOUNTER — Emergency Department: Payer: BLUE CROSS/BLUE SHIELD

## 2016-03-31 DIAGNOSIS — J45909 Unspecified asthma, uncomplicated: Secondary | ICD-10-CM | POA: Insufficient documentation

## 2016-03-31 DIAGNOSIS — Z794 Long term (current) use of insulin: Secondary | ICD-10-CM | POA: Insufficient documentation

## 2016-03-31 DIAGNOSIS — I10 Essential (primary) hypertension: Secondary | ICD-10-CM | POA: Diagnosis not present

## 2016-03-31 DIAGNOSIS — E119 Type 2 diabetes mellitus without complications: Secondary | ICD-10-CM | POA: Insufficient documentation

## 2016-03-31 DIAGNOSIS — R1011 Right upper quadrant pain: Secondary | ICD-10-CM | POA: Insufficient documentation

## 2016-03-31 DIAGNOSIS — R1031 Right lower quadrant pain: Secondary | ICD-10-CM

## 2016-03-31 HISTORY — DX: Unspecified asthma, uncomplicated: J45.909

## 2016-03-31 LAB — CBC
HEMATOCRIT: 45.4 % (ref 35.0–47.0)
Hemoglobin: 15.2 g/dL (ref 12.0–16.0)
MCH: 29.2 pg (ref 26.0–34.0)
MCHC: 33.5 g/dL (ref 32.0–36.0)
MCV: 87.2 fL (ref 80.0–100.0)
PLATELETS: 304 10*3/uL (ref 150–440)
RBC: 5.21 MIL/uL — ABNORMAL HIGH (ref 3.80–5.20)
RDW: 13.9 % (ref 11.5–14.5)
WBC: 17 10*3/uL — AB (ref 3.6–11.0)

## 2016-03-31 LAB — COMPREHENSIVE METABOLIC PANEL
ALT: 27 U/L (ref 14–54)
AST: 29 U/L (ref 15–41)
Albumin: 4.2 g/dL (ref 3.5–5.0)
Alkaline Phosphatase: 87 U/L (ref 38–126)
Anion gap: 7 (ref 5–15)
BUN: 30 mg/dL — AB (ref 6–20)
CHLORIDE: 105 mmol/L (ref 101–111)
CO2: 27 mmol/L (ref 22–32)
CREATININE: 1.16 mg/dL — AB (ref 0.44–1.00)
Calcium: 9.2 mg/dL (ref 8.9–10.3)
GFR calc Af Amer: >60 mL/min (ref >60)
GFR, EST NON AFRICAN AMERICAN: 53 mL/min — AB (ref >60)
Glucose, Bld: 171 mg/dL — ABNORMAL HIGH (ref 65–99)
Potassium: 4.4 mmol/L (ref 3.5–5.1)
Sodium: 139 mmol/L (ref 135–145)
TOTAL PROTEIN: 9.2 g/dL — AB (ref 6.5–8.1)
Total Bilirubin: 0.8 mg/dL (ref 0.3–1.2)

## 2016-03-31 LAB — URINALYSIS, COMPLETE (UACMP) WITH MICROSCOPIC
BILIRUBIN URINE: NEGATIVE
HGB URINE DIPSTICK: NEGATIVE
Ketones, ur: NEGATIVE mg/dL
LEUKOCYTES UA: NEGATIVE
NITRITE: NEGATIVE
Protein, ur: NEGATIVE mg/dL
SPECIFIC GRAVITY, URINE: 1.022 (ref 1.005–1.030)
pH: 5 (ref 5.0–8.0)

## 2016-03-31 LAB — LIPASE, BLOOD: LIPASE: 20 U/L (ref 11–51)

## 2016-03-31 MED ORDER — ONDANSETRON HCL 4 MG PO TABS: 4.0000 mg | ORAL_TABLET | Freq: Every day | ORAL | 1 refills | Status: AC | PRN

## 2016-03-31 MED ORDER — FENTANYL CITRATE (PF) 100 MCG/2ML IJ SOLN
25.0000 ug | Freq: Once | INTRAMUSCULAR | Status: AC
  Administered 2016-03-31: 25 ug via INTRAVENOUS
  Filled 2016-03-31: qty 2

## 2016-03-31 MED ORDER — OXYCODONE-ACETAMINOPHEN 5-325 MG PO TABS
1.0000 | ORAL_TABLET | Freq: Once | ORAL | Status: AC
  Administered 2016-03-31: 1 via ORAL
  Filled 2016-03-31: qty 1

## 2016-03-31 MED ORDER — SODIUM CHLORIDE 0.9 % IV BOLUS (SEPSIS)
1000.0000 mL | Freq: Once | INTRAVENOUS | Status: AC
  Administered 2016-03-31: 1000 mL via INTRAVENOUS

## 2016-03-31 MED ORDER — ONDANSETRON HCL 4 MG/2ML IJ SOLN
4.0000 mg | Freq: Once | INTRAMUSCULAR | Status: AC
  Administered 2016-03-31: 4 mg via INTRAVENOUS
  Filled 2016-03-31: qty 2

## 2016-03-31 MED ORDER — OXYCODONE-ACETAMINOPHEN 5-325 MG PO TABS: 1.0000 | ORAL_TABLET | ORAL | 0 refills | Status: DC | PRN

## 2016-03-31 MED ORDER — IOPAMIDOL (ISOVUE-300) INJECTION 61%
30.0000 mL | Freq: Once | INTRAVENOUS | Status: AC | PRN
  Administered 2016-03-31: 30 mL via ORAL

## 2016-03-31 MED ORDER — IOPAMIDOL (ISOVUE-300) INJECTION 61%
100.0000 mL | Freq: Once | INTRAVENOUS | Status: AC | PRN
  Administered 2016-03-31: 100 mL via INTRAVENOUS

## 2016-03-31 NOTE — ED Provider Notes (Signed)
[facility] Emergency Department Provider Note    First MD Initiated Contact with Patient 03/31/16 0310     (approximate)  I have reviewed the triage vital signs and the nursing notes.   HISTORY  Chief Complaint Abdominal Pain   HPI Jennifer Lozano is a 51 y.o. female with blow list of chronic medical conditions presents to the emergency department with right lower quadrant abdominal pain and vomiting with onset approximately 4 hours before presentation. Patient denies any fever or febrile on presentation temperature 97.7. Patient denies any diarrhea. Patient denies any urinary symptoms   Past Medical History:  Diagnosis Date  . Asthma   . Diabetes mellitus without complication (HCC)   . Hypertension     There are no active problems to display for this patient.   Past Surgical History:  Procedure Laterality Date  . ABDOMINAL HYSTERECTOMY    . CHOLECYSTECTOMY    . TONSILLECTOMY      Prior to Admission medications   Medication Sig Start Date End Date Taking? Authorizing Provider  famotidine (PEPCID) 20 MG tablet Take 1 tablet (20 mg total) by mouth daily. 01/17/15 01/17/16  Emily Filbert, MD  insulin aspart protamine - aspart (NOVOLOG 70/30 MIX) (70-30) 100 UNIT/ML FlexPen Inject 35-36 Units into the skin 2 (two) times daily with a meal. 36 units every morning and 35 units every evening.    Historical Provider, MD  lisinopril-hydrochlorothiazide (PRINZIDE,ZESTORETIC) 20-12.5 MG tablet Take 1 tablet by mouth daily.    Historical Provider, MD  sitaGLIPtin (JANUVIA) 100 MG tablet Take 100 mg by mouth daily.    Historical Provider, MD  sucralfate (CARAFATE) 1 g tablet Take 1 tablet (1 g total) by mouth 4 (four) times daily. 01/17/15 01/17/16  Emily Filbert, MD    Allergies Morphine and related  Family History  Problem Relation Age of Onset  . Diabetes Sister   . Diabetes Brother     Social History Social History  Substance Use Topics  .  Smoking status: Never Smoker  . Smokeless tobacco: Not on file  . Alcohol use No    Review of Systems Constitutional: No fever/chills Eyes: No visual changes. ENT: No sore throat. Cardiovascular: Denies chest pain. Respiratory: Denies shortness of breath. Gastrointestinal: Positive for abdominal pain and vomiting  Genitourinary: Negative for dysuria. Musculoskeletal: Negative for back pain. Skin: Negative for rash. Neurological: Negative for headaches, focal weakness or numbness.  10-point ROS otherwise negative.  ____________________________________________   PHYSICAL EXAM:  VITAL SIGNS: ED Triage Vitals  Enc Vitals Group     BP 03/31/16 0241 (!) 147/78     Pulse Rate 03/31/16 0241 92     Resp 03/31/16 0241 18     Temp 03/31/16 0241 97.7 F (36.5 C)     Temp Source 03/31/16 0241 Oral     SpO2 03/31/16 0241 98 %     Weight 03/31/16 0242 225 lb (102.1 kg)     Height 03/31/16 0242 5\' 3"  (1.6 m)     Head Circumference --      Peak Flow --      Pain Score 03/31/16 0243 10     Pain Loc --      Pain Edu? --      Excl. in GC? --     Constitutional: Alert and oriented. Well appearing and in no acute distress. Eyes: Conjunctivae are normal. PERRL. EOMI. Head: Atraumatic. Mouth/Throat: Mucous membranes are moist.  Oropharynx non-erythematous. Neck: No stridor. Cardiovascular: Normal rate,  regular rhythm. Good peripheral circulation. Grossly normal heart sounds. Respiratory: Normal respiratory effort.  No retractions. Lungs CTAB. Gastrointestinal:Right lower quadrant tenderness to palpation.. No distention.  Musculoskeletal: No lower extremity tenderness nor edema. No gross deformities of extremities. Neurologic:  Normal speech and language. No gross focal neurologic deficits are appreciated.  Skin:  Skin is warm, dry and intact. No rash noted. Psychiatric: Mood and affect are normal. Speech and behavior are normal.  ____________________________________________    LABS (all labs ordered are listed, but only abnormal results are displayed)  Labs Reviewed  COMPREHENSIVE METABOLIC PANEL - Abnormal; Notable for the following:       Result Value   Glucose, Bld 171 (*)    BUN 30 (*)    Creatinine, Ser 1.16 (*)    Total Protein 9.2 (*)    GFR calc non Af Amer 53 (*)    All other components within normal limits  CBC - Abnormal; Notable for the following:    WBC 17.0 (*)    RBC 5.21 (*)    All other components within normal limits  URINALYSIS, COMPLETE (UACMP) WITH MICROSCOPIC - Abnormal; Notable for the following:    Color, Urine STRAW (*)    APPearance CLEAR (*)    Glucose, UA >=500 (*)    Bacteria, UA RARE (*)    Squamous Epithelial / LPF 0-5 (*)    All other components within normal limits  LIPASE, BLOOD    RADIOLOGY I, Zihlman N Senai Kingsley, personally viewed and evaluated these images (plain radiographs) as part of my medical decision making, as well as reviewing the written report by the radiologist.  Ct Abdomen Pelvis W Contrast  Result Date: 03/31/2016 CLINICAL DATA:  Initial evaluation for acute right lower quadrant abdominal pain. Leukocytosis. EXAM: CT ABDOMEN AND PELVIS WITH CONTRAST TECHNIQUE: Multidetector CT imaging of the abdomen and pelvis was performed using the standard protocol following bolus administration of intravenous contrast. CONTRAST:  ISOVUE-300 IOPAMIDOL (ISOVUE-300) INJECTION 61% COMPARISON:  Prior CT from 09/23/2014. FINDINGS: Lower chest: Minimal subsegmental atelectasis present within the lung bases. Visualized lungs are otherwise clear. Hepatobiliary: Liver within normal limits. Gallbladder surgically absent. No biliary dilatation. Pancreas: Pancreas within normal limits. Spleen: Spleen within normal limits. Adrenals/Urinary Tract: Adrenal glands are normal. Kidneys equal size with symmetric enhancement. Punctate 2 mm nonobstructive right renal nephrolithiasis. No left-sided calculi. No hydronephrosis bilaterally.  No focal enhancing renal mass. No hydroureter. Bladder within normal limits. Stomach/Bowel: Stomach moderately distended with oral contrast material within the gastric lumen. No acute abnormality about the stomach. No evidence for bowel obstruction. Appendix within normal limits for caliber without acute inflammatory changes to suggest acute appendicitis. No abnormal wall thickening, mucosal enhancement, or inflammatory fat stranding seen about the bowels. Mild amount of retained stool within the colon, suggesting constipation. Vascular/Lymphatic: Normal intravascular enhancement seen throughout the intra-abdominal aorta and its branch vessels. Mild scattered plaque within the aorta. No aneurysm. No pathologically enlarged intra-abdominal or pelvic lymph nodes. Reproductive: Uterus is absent. Left ovary unremarkable. Right ovary not discretely identified. Other: No free intraperitoneal air.  No free fluid. Musculoskeletal: No acute osseous abnormality. No worrisome lytic or blastic osseous lesions. IMPRESSION: 1. No CT evidence for acute intra-abdominal or pelvic process. 2. Normal appendix. 3. Moderate amount of retained stool within the colon, suggesting constipation. 4. Status post cholecystectomy and hysterectomy. Electronically Signed   By: Rise Mu M.D.   On: 03/31/2016 05:04     Procedures   ____________________________________________   INITIAL IMPRESSION /  ASSESSMENT AND PLAN / ED COURSE  Pertinent labs & imaging results that were available during my care of the patient were reviewed by me and considered in my medical decision making (see chart for details).  51 year old female present with abdominal pain and vomiting. Laboratory data notable for CBC of 17. CT scan revealed "normal appendix" moderate amount of retained stool no other acute abnormality. Patient evaluated by Dr.Woodham general surgery stating no surgical intervention warranted at this time. Spoke with the patient at  length regarding possibly early appendicitis and as such she is advised to return to emergency department immediately if pain were to worsen vomiting fever or any other emergency medical concerns.     ____________________________________________  FINAL CLINICAL IMPRESSION(S) / ED DIAGNOSES  Final diagnoses:  Right lower quadrant abdominal pain  Right upper quadrant pain   MEDICATIONS GIVEN DURING THIS VISIT:  Medications  fentaNYL (SUBLIMAZE) injection 25 mcg (25 mcg Intravenous Given 03/31/16 0341)  ondansetron (ZOFRAN) injection 4 mg (4 mg Intravenous Given 03/31/16 0341)  sodium chloride 0.9 % bolus 1,000 mL (0 mLs Intravenous Stopped 03/31/16 0448)  iopamidol (ISOVUE-300) 61 % injection 30 mL (30 mLs Oral Contrast Given 03/31/16 0345)  iopamidol (ISOVUE-300) 61 % injection 100 mL (100 mLs Intravenous Contrast Given 03/31/16 0410)  fentaNYL (SUBLIMAZE) injection 25 mcg (25 mcg Intravenous Given 03/31/16 0604)     NEW OUTPATIENT MEDICATIONS STARTED DURING THIS VISIT:  New Prescriptions   No medications on file    Modified Medications   No medications on file    Discontinued Medications   No medications on file     Note:  This document was prepared using Dragon voice recognition software and may include unintentional dictation errors.    Darci Currentandolph N Macon Sandiford, MD 04/02/16 [id number]

## 2016-03-31 NOTE — ED Notes (Signed)
Pt assisted up to urinate ° °

## 2016-03-31 NOTE — ED Notes (Signed)
Pt requesting pain medication at this time.

## 2016-03-31 NOTE — ED Notes (Signed)
Report to jerry, rn.  

## 2016-03-31 NOTE — ED Notes (Signed)
Pt requesting additional pain medication, md notified.  

## 2016-03-31 NOTE — ED Notes (Signed)
Patient transported to CT 

## 2016-03-31 NOTE — ED Notes (Signed)
Pt declines offer for additional pain medication. Pt states "I want to wait".

## 2016-03-31 NOTE — ED Notes (Addendum)
Dr. Tonita Congwoodham in to see pt. Pt declines offer for further pain medication.

## 2016-03-31 NOTE — Consult Note (Signed)
Patient ID: Jennifer Lozano, female   DOB: 1966-01-08, 51 y.o.   MRN: [id number]  CC: ABDOMINAL PAIN  HPI Jennifer Lozano is a 51 y.o. female is an insulin-dependent diabetic who presents emergency department with a 6 hour history of abdominal pain. Patient reports abdominal pain just like this approximately 10 years ago where she was in the hospital several days being evaluated for possible appendicitis. Patient reports she had subsequent nausea and vomiting associated with this but denies any other symptoms. She denies any fevers, chills, chest pain, shortness breath, diarrhea, constipation. Her last bowel movement was just prior to coming to the emergency department and was normal for her. The pain is acute, sharp, stabbing in the right side of her abdomen. She denies any recent sick contacts, any recent illness, any new changes in medical conditions. She does report that she thinks she finally found a job and is post spinal for sure on Monday. She is otherwise in her usual state of health.  HPI  Past Medical History:  Diagnosis Date  . Asthma   . Diabetes mellitus without complication (HCC)   . Hypertension     Past Surgical History:  Procedure Laterality Date  . ABDOMINAL HYSTERECTOMY    . CHOLECYSTECTOMY    . TONSILLECTOMY      Family History  Problem Relation Age of Onset  . Diabetes Sister   . Diabetes Brother     Social History Social History  Substance Use Topics  . Smoking status: Never Smoker  . Smokeless tobacco: Not on file  . Alcohol use No    Allergies  Allergen Reactions  . Morphine And Related Palpitations    No current facility-administered medications for this encounter.    Current Outpatient Prescriptions  Medication Sig Dispense Refill  . famotidine (PEPCID) 20 MG tablet Take 1 tablet (20 mg total) by mouth daily. 30 tablet 1  . insulin aspart protamine - aspart (NOVOLOG 70/30 MIX) (70-30) 100 UNIT/ML FlexPen Inject 35-36 Units into the skin 2 (two)  times daily with a meal. 36 units every morning and 35 units every evening.    Marland Kitchen lisinopril-hydrochlorothiazide (PRINZIDE,ZESTORETIC) 20-12.5 MG tablet Take 1 tablet by mouth daily.    . sitaGLIPtin (JANUVIA) 100 MG tablet Take 100 mg by mouth daily.    . sucralfate (CARAFATE) 1 g tablet Take 1 tablet (1 g total) by mouth 4 (four) times daily. 120 tablet 1     Review of Systems A Multi-point review of systems was asked and was negative except for the findings documented in the history of present illness  Physical Exam Blood pressure 107/63, pulse 66, temperature 97.7 F (36.5 C), temperature source Oral, resp. rate (!) 22, height 5\' 3"  (1.6 m), weight 102.1 kg (225 lb), SpO2 98 %. CONSTITUTIONAL: No acute distress. EYES: Pupils are equal, round, and reactive to light, Sclera are non-icteric. EARS, NOSE, MOUTH AND THROAT: The oropharynx is clear. The oral mucosa is pink and moist. Hearing is intact to voice. LYMPH NODES:  Lymph nodes in the neck are normal. RESPIRATORY:  Lungs are clear. There is normal respiratory effort, with equal breath sounds bilaterally, and without pathologic use of accessory muscles. CARDIOVASCULAR: Heart is regular without murmurs, gallops, or rubs. GI: The abdomen is large, soft, tender to palpation in the right mid to upper quadrant without rebound, distention, peritonitis, and nondistended. There are no palpable masses. There is no hepatosplenomegaly. There are normal bowel sounds in all quadrants. GU: Rectal deferred.   MUSCULOSKELETAL: Normal muscle  strength and tone. No cyanosis or edema.   SKIN: Turgor is good and there are no pathologic skin lesions or ulcers. NEUROLOGIC: Motor and sensation is grossly normal. Cranial nerves are grossly intact. PSYCH:  Oriented to person, place and time. Affect is normal.  Data Reviewed Images and labs reviewed which show a leukocytosis of 17,000, mildly elevated creatinine of 1.16, BUN of 30. The remainder of her labs are  within normal limits. Urine does show some rare bacteria but appears to be contaminated. CT scan of the abdomen shows copious amounts of stool within the large bowel and a completely normal appendix. It is decompressed and without any evidence of inflammation pointing into the pelvis. There are no free air or free fluid on the CT scan I have personally reviewed the patient's imaging, laboratory findings and medical records.    Assessment    Abdominal pain    Plan    51 year old obese female that his insulin pump dependent presenting with acute onset right-sided abdominal pain. No evidence of appendicitis on my exam or on the CT scan. Discussed with the patient given her leukocytosis, nausea, vomiting, abdominal pain that she would likely warrant further investigation. This likely represents an enteritis but could also be indicative of slow colonic transit or from scar tissue from prior surgeries. The leukocytosis would be rare in those instances but this may also be from an enteritis. No current indications for surgical intervention. Would recommend evaluation by internal medicine/PCP and possibly GI. The surgical services will follow if the patient is admitted.     Time spent with the patient was 45 minutes, with more than 50% of the time spent in face-to-face education, counseling and care coordination.     Ricarda Frameharles Veeda Virgo, MD FACS General Surgeon 03/31/2016, 6:40 AM

## 2016-03-31 NOTE — ED Triage Notes (Signed)
Pt to triage via wheelchair. Pt reports pain to her RLQ abd that started about 3 to 4 hour ago. Pt reports vomited x 2 denies diarrhea. Pt states she thinks it may be her appendix.

## 2016-04-01 ENCOUNTER — Emergency Department
Admission: EM | Admit: 2016-04-01 | Discharge: 2016-04-01 | Disposition: A | Discharge status: Home / Self Care | Payer: BLUE CROSS/BLUE SHIELD | Attending: Emergency Medicine | Admitting: Emergency Medicine

## 2016-04-01 ENCOUNTER — Encounter: Payer: Self-pay | Admitting: Emergency Medicine

## 2016-04-01 DIAGNOSIS — R1031 Right lower quadrant pain: Secondary | ICD-10-CM | POA: Diagnosis not present

## 2016-04-01 DIAGNOSIS — Z79899 Other long term (current) drug therapy: Secondary | ICD-10-CM | POA: Diagnosis not present

## 2016-04-01 DIAGNOSIS — E119 Type 2 diabetes mellitus without complications: Secondary | ICD-10-CM | POA: Diagnosis not present

## 2016-04-01 DIAGNOSIS — R1011 Right upper quadrant pain: Secondary | ICD-10-CM

## 2016-04-01 DIAGNOSIS — J45909 Unspecified asthma, uncomplicated: Secondary | ICD-10-CM | POA: Insufficient documentation

## 2016-04-01 DIAGNOSIS — R1110 Vomiting, unspecified: Secondary | ICD-10-CM | POA: Diagnosis not present

## 2016-04-01 DIAGNOSIS — I10 Essential (primary) hypertension: Secondary | ICD-10-CM | POA: Insufficient documentation

## 2016-04-01 DIAGNOSIS — Z794 Long term (current) use of insulin: Secondary | ICD-10-CM | POA: Insufficient documentation

## 2016-04-01 LAB — DIFFERENTIAL
BASOS ABS: 0.1 10*3/uL (ref 0–0.1)
Basophils Relative: 1 %
EOS ABS: 0.3 10*3/uL (ref 0–0.7)
Eosinophils Relative: 3 %
LYMPHS ABS: 3 10*3/uL (ref 1.0–3.6)
LYMPHS PCT: 35 %
MONOS PCT: 7 %
Monocytes Absolute: 0.6 10*3/uL (ref 0.2–0.9)
Neutro Abs: 4.8 10*3/uL (ref 1.4–6.5)
Neutrophils Relative %: 54 %

## 2016-04-01 LAB — COMPREHENSIVE METABOLIC PANEL
ALBUMIN: 3.6 g/dL (ref 3.5–5.0)
ALT: 19 U/L (ref 14–54)
AST: 23 U/L (ref 15–41)
Alkaline Phosphatase: 79 U/L (ref 38–126)
Anion gap: 5 (ref 5–15)
BILIRUBIN TOTAL: 0.6 mg/dL (ref 0.3–1.2)
BUN: 20 mg/dL (ref 6–20)
CALCIUM: 8.8 mg/dL — AB (ref 8.9–10.3)
CO2: 29 mmol/L (ref 22–32)
CREATININE: 1.08 mg/dL — AB (ref 0.44–1.00)
Chloride: 104 mmol/L (ref 101–111)
GFR calc Af Amer: >60 mL/min (ref >60)
GFR, EST NON AFRICAN AMERICAN: 58 mL/min — AB (ref >60)
Glucose, Bld: 145 mg/dL — ABNORMAL HIGH (ref 65–99)
Potassium: 4.1 mmol/L (ref 3.5–5.1)
Sodium: 138 mmol/L (ref 135–145)
TOTAL PROTEIN: 7.8 g/dL (ref 6.5–8.1)

## 2016-04-01 LAB — URINALYSIS, COMPLETE (UACMP) WITH MICROSCOPIC
BILIRUBIN URINE: NEGATIVE
Glucose, UA: >=500 mg/dL — AB
HGB URINE DIPSTICK: NEGATIVE
Ketones, ur: NEGATIVE mg/dL
LEUKOCYTES UA: NEGATIVE
NITRITE: NEGATIVE
PH: 6 (ref 5.0–8.0)
Protein, ur: NEGATIVE mg/dL
SPECIFIC GRAVITY, URINE: 1.024 (ref 1.005–1.030)

## 2016-04-01 LAB — CBC
HCT: 41.7 % (ref 35.0–47.0)
Hemoglobin: 14.3 g/dL (ref 12.0–16.0)
MCH: 29.7 pg (ref 26.0–34.0)
MCHC: 34.4 g/dL (ref 32.0–36.0)
MCV: 86.4 fL (ref 80.0–100.0)
PLATELETS: 278 10*3/uL (ref 150–440)
RBC: 4.82 MIL/uL (ref 3.80–5.20)
RDW: 13.9 % (ref 11.5–14.5)
WBC: 8.8 10*3/uL (ref 3.6–11.0)

## 2016-04-01 LAB — LIPASE, BLOOD: Lipase: 20 U/L (ref 11–51)

## 2016-04-01 MED ORDER — PANTOPRAZOLE SODIUM 40 MG PO TBEC: 40.0000 mg | DELAYED_RELEASE_TABLET | Freq: Every day | ORAL | 1 refills | Status: DC

## 2016-04-01 MED ORDER — GI COCKTAIL ~~LOC~~
30.0000 mL | Freq: Once | ORAL | Status: AC
Start: 2016-04-01 — End: 2016-04-01
  Administered 2016-04-01: 30 mL via ORAL
  Filled 2016-04-01: qty 30

## 2016-04-01 NOTE — Discharge Instructions (Signed)
Please return for worse pain fever vomiting or any other symptoms. Please take the Protonix one pill a day. Please follow-up with your regular doctor. This pain may be due to an ulcer. If so the Protonix should improve considerably.

## 2016-04-01 NOTE — ED Notes (Signed)
Pt has finished eating and medication has been administered.

## 2016-04-01 NOTE — ED Notes (Signed)
Pt given Jennifer Lozano sandwich and ginger ale by Dr. Darnelle CatalanMalinda. Dr. Darnelle CatalanMalinda wants pt to eat and then have GI cocktail. Pt in NAD at this time and is sitting eating and watching TV.

## 2016-04-01 NOTE — ED Triage Notes (Signed)
Pt presents to ED c/o RLQ abd pain. Seen yesterday in ED and was ruled out for appendicitis on CT. Pt states she had an elevated WBC yesterday and they didn't know why but still sent her home.

## 2016-04-01 NOTE — ED Provider Notes (Signed)
[facility] Emergency Department Provider Note   ____________________________________________   First MD Initiated Contact with Patient 04/01/16 1900     (approximate)  I have reviewed the triage vital signs and the nursing notes.   HISTORY  Chief Complaint Abdominal Pain    HPI Jennifer Lozano is a 51 y.o. female patient comes back complaining pain in the right upper quadrant. She vomited a little bit but has no diarrhea she's having regular stools. CT yesterday was done and was essentially normal except for possible constipation. On exam patient is tender in the right upper quadrant palpation and percussion. This is the same pain she's been having. She was given a [place] sandwich and ginger ale when she said she was hungry but did not seem to help anything and she got a GI cocktail which made the pain resolved. Pain was moderate and achy in nature. It was made worse by palpation but not worse with movement.  Past Medical History:  Diagnosis Date  . Asthma   . Diabetes mellitus without complication (HCC)   . Hypertension     There are no active problems to display for this patient.   Past Surgical History:  Procedure Laterality Date  . ABDOMINAL HYSTERECTOMY    . CHOLECYSTECTOMY    . TONSILLECTOMY      Prior to Admission medications   Medication Sig Start Date End Date Taking? Authorizing Provider  INVOKANA 100 MG TABS tablet Take 100 mg by mouth daily. 03/26/16   Historical Provider, MD  levothyroxine (SYNTHROID, LEVOTHROID) 137 MCG tablet Take 137 mcg by mouth daily. 02/20/16   Historical Provider, MD  lisinopril-hydrochlorothiazide (PRINZIDE,ZESTORETIC) 20-25 MG tablet Take 1 tablet by mouth daily. 03/26/16   Historical Provider, MD  NOVOLOG 100 UNIT/ML injection Inject 1 Dose into the skin as needed. Adjust insulin pump according to carb intake 03/15/16   Historical Provider, MD  ondansetron (ZOFRAN) 4 MG tablet Take 1 tablet (4 mg total) by mouth  daily as needed for nausea or vomiting. 03/31/16 03/31/17  Darci Currentandolph N Brown, MD  oxyCODONE-acetaminophen (ROXICET) 5-325 MG tablet Take 1 tablet by mouth every 4 (four) hours as needed for severe pain. 03/31/16   Darci Currentandolph N Brown, MD  pravastatin (PRAVACHOL) 40 MG tablet Take 40 mg by mouth daily. 03/13/16   Historical Provider, MD    Allergies Morphine and related  Family History  Problem Relation Age of Onset  . Diabetes Sister   . Diabetes Brother     Social History Social History  Substance Use Topics  . Smoking status: Never Smoker  . Smokeless tobacco: Never Used  . Alcohol use No    Review of Systems Constitutional: No fever/chills Eyes: No visual changes. ENT: No sore throat. Cardiovascular: Denies chest pain. Respiratory: Denies shortness of breath. Gastrointestinal:  See history of present illness Genitourinary: Negative for dysuria. Musculoskeletal: Negative for back pain. Skin: Negative for rash. Neurological: Negative for headaches, focal weakness or numbness.  10-point ROS otherwise negative.  ____________________________________________   PHYSICAL EXAM:  VITAL SIGNS: ED Triage Vitals [04/01/16 1659]  Enc Vitals Group     BP (!) 154/57     Pulse Rate 83     Resp 18     Temp 97.7 F (36.5 C)     Temp Source Oral     SpO2 98 %     Weight 225 lb (102.1 kg)     Height 5\' 3"  (1.6 m)     Head Circumference  Peak Flow      Pain Score 10     Pain Loc      Pain Edu?      Excl. in GC?     Constitutional: Alert and oriented. Well appearing and in no acute distress. Eyes: Conjunctivae are normal. PERRL. EOMI. Head: Atraumatic. Nose: No congestion/rhinnorhea. Mouth/Throat: Mucous membranes are moist.  Oropharynx non-erythematous. Neck: No stridor.   Cardiovascular: Normal rate, regular rhythm. Grossly normal heart sounds.  Good peripheral circulation. Respiratory: Normal respiratory effort.  No retractions. Lungs CTAB. Gastrointestinal: Soft Tender  to palpation percussion right upper quadrant in the area normally would be associated with gallbladder disease. Palpation near exactly reproduces her pain. Patient has had her gallbladder out. No distention. No abdominal bruits. No CVA tenderness. }Musculoskeletal: No lower extremity tenderness nor edema.  No joint effusions. Neurologic:  Normal speech and language. No gross focal neurologic deficits are appreciated. No gait instability. Skin:  Skin is warm, dry and intact. No rash noted. Psychiatric: Mood and affect are normal. Speech and behavior are normal.  ____________________________________________   LABS (all labs ordered are listed, but only abnormal results are displayed)  Labs Reviewed  COMPREHENSIVE METABOLIC PANEL - Abnormal; Notable for the following:       Result Value   Glucose, Bld 145 (*)    Creatinine, Ser 1.08 (*)    Calcium 8.8 (*)    GFR calc non Af Amer 58 (*)    All other components within normal limits  URINALYSIS, COMPLETE (UACMP) WITH MICROSCOPIC - Abnormal; Notable for the following:    Color, Urine YELLOW (*)    APPearance HAZY (*)    Glucose, UA >=500 (*)    Bacteria, UA RARE (*)    Squamous Epithelial / LPF 0-5 (*)    All other components within normal limits  LIPASE, BLOOD  CBC  DIFFERENTIAL   ____________________________________________  EKG   ____________________________________________  RADIOLOGY   ____________________________________________   PROCEDURES  Procedure(s) performed:   Procedures  Critical Care performed:  ____________________________________________   INITIAL IMPRESSION / ASSESSMENT AND PLAN / ED COURSE  Pertinent labs & imaging results that were available during my care of the patient were reviewed by me and considered in my medical decision making (see chart for details).   Patient has no other complaints of chest pain nausea shortness of breath only right upper quadrant pain exactly reproduced by palpation  percussion. Resolved with GI cocktail. We'll treat her for possible ulcer.     ____________________________________________   FINAL CLINICAL IMPRESSION(S) / ED DIAGNOSES  Final diagnoses:  Right upper quadrant abdominal pain      NEW MEDICATIONS STARTED DURING THIS VISIT:  New Prescriptions   No medications on file     Note:  This document was prepared using Dragon voice recognition software and may include unintentional dictation errors.    Arnaldo Natal, MD 04/01/16 [phone]

## 2016-04-01 NOTE — ED Notes (Signed)
Pt stating "well here for the 2nd time." Pt stating that pain originally started on Friday and was d/c home and told to come back for increased pain. Pt is pointing to her RUQ as source of pain. Pt stating that she vomited a "little bit,"and states it was food.  Pt denying nausea. Pt denying any urination difficulties or changes. Pt denying any changes in her BM . Pt in NAD at this time.

## 2016-07-21 ENCOUNTER — Ambulatory Visit (INDEPENDENT_AMBULATORY_CARE_PROVIDER_SITE_OTHER): Payer: BLUE CROSS/BLUE SHIELD

## 2016-07-21 ENCOUNTER — Ambulatory Visit
Admission: EM | Admit: 2016-07-21 | Discharge: 2016-07-21 | Disposition: A | Discharge status: Home / Self Care | Payer: BLUE CROSS/BLUE SHIELD | Attending: Family Medicine | Admitting: Family Medicine

## 2016-07-21 ENCOUNTER — Encounter: Payer: Self-pay | Admitting: Gynecology

## 2016-07-21 DIAGNOSIS — M79672 Pain in left foot: Secondary | ICD-10-CM

## 2016-07-21 MED ORDER — MELOXICAM 15 MG PO TABS: 15.0000 mg | ORAL_TABLET | Freq: Every day | ORAL | 0 refills | Status: DC | PRN

## 2016-07-21 NOTE — Discharge Instructions (Signed)
Take medication as prescribed. Rest. Drink plenty of fluids. Ice. Stretch. Elevate.  Follow up with podiatry as needed for continued pain.   Follow up with your primary care physician this week as needed. Return to Urgent care for new or worsening concerns.

## 2016-07-21 NOTE — ED Triage Notes (Signed)
Per patient step on a Acorn x 3-4 days ago. Per patient now with left heel and ankle pain.

## 2016-07-21 NOTE — ED Provider Notes (Signed)
[facility] ____________________________________________  Time seen: Approximately 9:25 AM  I have reviewed the triage vital signs and the nursing notes.   HISTORY  Chief Complaint Ankle Pain   HPI Arlita Buffkin is a 51 y.o. female presenting for evaluation of left foot and left heel pain that has been present for the last 3-4 days. Patient reports that she was walking in flip-flops and stepped on an acorn and unsure if she rolled her foot that has had pain since. Denies fall to the ground, head injury or loss consciousness. Denies other pain or injuries. States pain is very mild at this time, moderate active weightbearing. Reports pain does improve slightly with rest, but no resolution and then worsens with active range of motion. Has not been taking any over-the-counter medications for the same complaints has not been attempting any other alleviating measures. Reports approximate 7 months ago she had what she describes as stress fracture to the heel and surrounding bone. Reports a that time the area was wrapped and put in a boot. Reports otherwise feeling well. Denies paresthesias, decreased range of motion or pain radiation. Reports chronic history of diabetes. Denies known osteoporosis. Denies recent sickness. Denies recent antibiotic use.   [facility] No LMP recorded. Patient has had a hysterectomy.  Past Medical History:  Diagnosis Date  . Asthma   . Diabetes mellitus without complication (HCC)   . Hypertension     There are no active problems to display for this patient.   Past Surgical History:  Procedure Laterality Date  . ABDOMINAL HYSTERECTOMY    . CHOLECYSTECTOMY    . TONSILLECTOMY       No current facility-administered medications for this encounter.   Current Outpatient Prescriptions:  .  levothyroxine (SYNTHROID, LEVOTHROID) 137 MCG tablet, Take 137 mcg by mouth daily., Disp: , Rfl: 10 .  lisinopril-hydrochlorothiazide  (PRINZIDE,ZESTORETIC) 20-25 MG tablet, Take 1 tablet by mouth daily., Disp: , Rfl: 4 .  NOVOLOG 100 UNIT/ML injection, Inject 1 Dose into the skin as needed. Adjust insulin pump according to carb intake, Disp: , Rfl: 2 .  INVOKANA 100 MG TABS tablet, Take 100 mg by mouth daily., Disp: , Rfl: 2 .  meloxicam (MOBIC) 15 MG tablet, Take 1 tablet (15 mg total) by mouth daily as needed., Disp: 10 tablet, Rfl: 0 .  ondansetron (ZOFRAN) 4 MG tablet, Take 1 tablet (4 mg total) by mouth daily as needed for nausea or vomiting., Disp: 30 tablet, Rfl: 1 .  oxyCODONE-acetaminophen (ROXICET) 5-325 MG tablet, Take 1 tablet by mouth every 4 (four) hours as needed for severe pain., Disp: 20 tablet, Rfl: 0 .  pantoprazole (PROTONIX) 40 MG tablet, Take 1 tablet (40 mg total) by mouth daily., Disp: 30 tablet, Rfl: 1 .  pravastatin (PRAVACHOL) 40 MG tablet, Take 40 mg by mouth daily., Disp: , Rfl: 2  Allergies Morphine and related  Family History  Problem Relation Age of Onset  . Diabetes Sister   . Diabetes Brother     Social History Social History  Substance Use Topics  . Smoking status: Never Smoker  . Smokeless tobacco: Never Used  . Alcohol use No    Review of Systems Constitutional: No fever/chills Cardiovascular: Denies chest pain. Respiratory: Denies shortness of breath. Gastrointestinal: No abdominal pain.   Genitourinary: Negative for dysuria. Musculoskeletal: Negative for back pain. As above.  Skin: Negative for rash.  ____________________________________________   PHYSICAL EXAM:  VITAL SIGNS: ED Triage Vitals  Enc Vitals Group  BP 07/21/16 0906 (!) 146/75     Pulse Rate 07/21/16 0906 85     Resp 07/21/16 0906 18     Temp 07/21/16 0906 98.1 F (36.7 C)     Temp Source 07/21/16 0906 Oral     SpO2 07/21/16 0906 98 %     Weight 07/21/16 0904 228 lb (103.4 kg)     Height 07/21/16 0904 5\' 3"  (1.6 m)     Head Circumference --      Peak Flow --      Pain Score 07/21/16 0904 9       Pain Loc --      Pain Edu? --      Excl. in GC? --     Constitutional: Alert and oriented. Well appearing and in no acute distress. Cardiovascular: Normal rate, regular rhythm. Grossly normal heart sounds.  Good peripheral circulation. Respiratory: Normal respiratory effort without tachypnea nor retractions. Breath sounds are clear and equal bilaterally. No wheezes, rales, rhonchi. Musculoskeletal:  Ambulatory with steady gait. Except: Left plantar heel mild to moderate tenderness to palpation with mild tenderness left lateral heel left lateral malleolus and proximal midfoot, no swelling, no ecchymosis, full range of motion present, mild pain with resisted plantarflexion and dorsiflexion, normal distal sensation and capillary refill, bilateral distal pedal pulses equal and easily palpated, left lower extremities otherwise nontender. No calf tenderness. Neurologic:  Normal speech and language. No gross focal neurologic deficits are appreciated. Speech is normal. No gait instability.  Skin:  Skin is warm, dry and intact. No rash noted. Psychiatric: Mood and affect are normal. Speech and behavior are normal. Patient exhibits appropriate insight and judgment   ___________________________________________   LABS (all labs ordered are listed, but only abnormal results are displayed)  Labs Reviewed - No data to display  RADIOLOGY  Dg Ankle Complete Left  Result Date: 07/21/2016 CLINICAL DATA:  Left lateral ankle pain and heel pain EXAM: LEFT ANKLE COMPLETE - 3+ VIEW COMPARISON:  None. FINDINGS: Plantar and posterior calcaneal spurs. No acute bony abnormality. Specifically, no fracture, subluxation, or dislocation. Soft tissues are intact. IMPRESSION: No acute bony abnormality.  Calcaneal spurs. Electronically Signed   By: Charlett NoseKevin  Dover M.D.   On: 07/21/2016 10:06   Dg Foot Complete Left  Result Date: 07/21/2016 CLINICAL DATA:  Lateral ankle pain.  Heel pain. EXAM: LEFT FOOT - COMPLETE 3+  VIEW COMPARISON:  Ankle series performed today. FINDINGS: Plantar and posterior calcaneal spurs. No acute bony abnormality. Specifically, no fracture, subluxation, or dislocation. Soft tissues are intact. IMPRESSION: No acute bony abnormality.  Calcaneal spurs. Electronically Signed   By: Charlett NoseKevin  Dover M.D.   On: 07/21/2016 10:06   ____________________________________________   PROCEDURES Procedures    INITIAL IMPRESSION / ASSESSMENT AND PLAN / ED COURSE  Pertinent labs & imaging results that were available during my care of the patient were reviewed by me and considered in my medical decision making (see chart for details).  Very well-appearing patient. No acute distress. Presents for evaluation of left heel and left foot pain. Will evaluate by x-ray.  Left foot and left ankle xrays no acute bony abnormality, calcaneal heel spurs. Suspect contusion injury, sprain and also discussed concerning for component of plantar fasciitis. Ace bandage applied for support as well as postoperative shoe given. Will treat patient with daily oral mobile. Encouraged rest, ice, stretching and follow-up with podiatry.Discussed indication, risks and benefits of medications with patient.  Discussed follow up with Primary care physician this week. Discussed  follow up and return parameters including no resolution or any worsening concerns. Patient verbalized understanding and agreed to plan.   ____________________________________________   FINAL CLINICAL IMPRESSION(S) / ED DIAGNOSES  Final diagnoses:  Pain of left heel  Left foot pain     Discharge Medication List as of 07/21/2016 10:46 AM    START taking these medications   Details  meloxicam (MOBIC) 15 MG tablet Take 1 tablet (15 mg total) by mouth daily as needed., Starting Sat 07/21/2016, Normal        Note: This dictation was prepared with Dragon dictation along with smaller phrase technology. Any transcriptional errors that result from this  process are unintentional.         Renford Dills, NP 07/21/16 1223

## 2016-08-15 ENCOUNTER — Ambulatory Visit: Payer: Self-pay | Admitting: Podiatry

## 2016-09-16 ENCOUNTER — Ambulatory Visit
Admission: EM | Admit: 2016-09-16 | Discharge: 2016-09-16 | Disposition: A | Discharge status: Home / Self Care | Payer: BLUE CROSS/BLUE SHIELD | Attending: Family Medicine | Admitting: Family Medicine

## 2016-09-16 ENCOUNTER — Encounter: Payer: Self-pay | Admitting: Gynecology

## 2016-09-16 DIAGNOSIS — N3001 Acute cystitis with hematuria: Secondary | ICD-10-CM | POA: Diagnosis not present

## 2016-09-16 DIAGNOSIS — E1140 Type 2 diabetes mellitus with diabetic neuropathy, unspecified: Secondary | ICD-10-CM | POA: Insufficient documentation

## 2016-09-16 DIAGNOSIS — E119 Type 2 diabetes mellitus without complications: Secondary | ICD-10-CM

## 2016-09-16 DIAGNOSIS — Z794 Long term (current) use of insulin: Secondary | ICD-10-CM | POA: Diagnosis not present

## 2016-09-16 DIAGNOSIS — N390 Urinary tract infection, site not specified: Secondary | ICD-10-CM | POA: Diagnosis not present

## 2016-09-16 DIAGNOSIS — R1030 Lower abdominal pain, unspecified: Secondary | ICD-10-CM | POA: Diagnosis present

## 2016-09-16 LAB — GLUCOSE, CAPILLARY: GLUCOSE-CAPILLARY: 186 mg/dL — AB (ref 65–99)

## 2016-09-16 LAB — URINALYSIS, COMPLETE (UACMP) WITH MICROSCOPIC
Glucose, UA: NEGATIVE mg/dL
KETONES UR: NEGATIVE mg/dL
Nitrite: NEGATIVE
Protein, ur: >300 mg/dL — AB
SPECIFIC GRAVITY, URINE: 1.025 (ref 1.005–1.030)
pH: 5.5 (ref 5.0–8.0)

## 2016-09-16 MED ORDER — FLUCONAZOLE 150 MG PO TABS: 150.0000 mg | ORAL_TABLET | Freq: Once | ORAL | 0 refills | Status: AC

## 2016-09-16 MED ORDER — NITROFURANTOIN MONOHYD MACRO 100 MG PO CAPS: 100.0000 mg | ORAL_CAPSULE | Freq: Two times a day (BID) | ORAL | 0 refills | Status: DC

## 2016-09-16 MED ORDER — PHENAZOPYRIDINE HCL 200 MG PO TABS: 200.0000 mg | ORAL_TABLET | Freq: Three times a day (TID) | ORAL | 0 refills | Status: DC | PRN

## 2016-09-16 NOTE — ED Triage Notes (Signed)
Patient c/o urine pressure and painful urination x 2 days.

## 2016-09-16 NOTE — ED Provider Notes (Addendum)
[facility]    CSN: [id number] Arrival date & time: 09/16/16  [id number]     History   Chief Complaint Chief Complaint  Patient presents with  . Urinary Tract Infection    HPI Jennifer Lozano is a 51 y.o. female.   Patient is a 51 year old white female with a history of recurrent UTIs and diabetes. She reports Thursday evening started having discomfort and pain in the lower abdomen. She reports when she does go she has urge she goes and that after she goes this discomfort pain and burning dense.. This is typical for UTIs. When her diabetes was poorly controlled she most more infrequent than on was a year since her last one. She has multiple issues with her diabetes including bleeding in her right eye which she gets injections from and she has a conjunctivae hemorrhage from the last injection. She also reports having neuropathy she is on multiple medications for diabetes she's lost about 30 pounds which is helped her A1c keep bringing it from over eating 11 down to in the 7 range. She is on insulin pump which came off while she was here. Previous surgery she's had abdominal hysterectomy cholecystectomy and tonsillectomy. Along with diabetes she's had a history of asthma and hypertension. Family history diabetes the family and she never smoked. She is allergic to morphine and other related drugs.   The history is provided by the patient. No language interpreter was used.  Urinary Tract Infection  Pain quality:  Shooting Pain severity:  Moderate Onset quality:  Sudden Duration:  3 days Timing:  Constant Progression:  Worsening Chronicity:  New Recent urinary tract infections: no   Relieved by:  Nothing Worsened by:  Nothing Associated symptoms: abdominal pain   Risk factors: recurrent urinary tract infections     Past Medical History:  Diagnosis Date  . Asthma   . Diabetes mellitus without complication (HCC)   . Hypertension     There are no active problems to display  for this patient.   Past Surgical History:  Procedure Laterality Date  . ABDOMINAL HYSTERECTOMY    . CHOLECYSTECTOMY    . TONSILLECTOMY      OB History    No data available       Home Medications    Prior to Admission medications   Medication Sig Start Date End Date Taking? Authorizing Provider  INVOKANA 100 MG TABS tablet Take 100 mg by mouth daily. 03/26/16  Yes [provider]  levothyroxine (SYNTHROID, LEVOTHROID) 137 MCG tablet Take 137 mcg by mouth daily. 02/20/16  Yes [provider]  lisinopril-hydrochlorothiazide (PRINZIDE,ZESTORETIC) 20-25 MG tablet Take 1 tablet by mouth daily. 03/26/16  Yes [provider]  meloxicam (MOBIC) 15 MG tablet Take 1 tablet (15 mg total) by mouth daily as needed. 07/21/16  Yes Renford DillsMiller, Lindsey, NP  NOVOLOG 100 UNIT/ML injection Inject 1 Dose into the skin as needed. Adjust insulin pump according to carb intake 03/15/16  Yes [provider]  ondansetron (ZOFRAN) 4 MG tablet Take 1 tablet (4 mg total) by mouth daily as needed for nausea or vomiting. 03/31/16 03/31/17 Yes Darci CurrentBrown, Schuyler N, MD  oxyCODONE-acetaminophen (ROXICET) 5-325 MG tablet Take 1 tablet by mouth every 4 (four) hours as needed for severe pain. 03/31/16  Yes Darci CurrentBrown, Packwood N, MD  pantoprazole (PROTONIX) 40 MG tablet Take 1 tablet (40 mg total) by mouth daily. 04/01/16 04/01/17 Yes Arnaldo NatalMalinda, Paul F, MD  pravastatin (PRAVACHOL) 40 MG tablet Take 40 mg by mouth daily.  03/13/16  Yes [provider]  fluconazole (DIFLUCAN) 150 MG tablet Take 1 tablet (150 mg total) by mouth once. 09/16/16 09/16/16  Hassan Rowan, MD  nitrofurantoin, macrocrystal-monohydrate, (MACROBID) 100 MG capsule Take 1 capsule (100 mg total) by mouth 2 (two) times daily. 09/16/16   Hassan Rowan, MD  phenazopyridine (PYRIDIUM) 200 MG tablet Take 1 tablet (200 mg total) by mouth 3 (three) times daily as needed for pain. 09/16/16   Hassan Rowan, MD    Family History Family History  Problem  Relation Age of Onset  . Diabetes Sister   . Diabetes Brother     Social History Social History  Substance Use Topics  . Smoking status: Never Smoker  . Smokeless tobacco: Never Used  . Alcohol use No     Allergies   Morphine and related   Review of Systems Review of Systems  Gastrointestinal: Positive for abdominal pain.  Genitourinary: Positive for decreased urine volume, difficulty urinating, dysuria and urgency.  All other systems reviewed and are negative.    Physical Exam Triage Vital Signs ED Triage Vitals  Enc Vitals Group     BP 09/16/16 1016 131/81     Pulse Rate 09/16/16 1016 85     Resp 09/16/16 1016 16     Temp 09/16/16 1016 98.2 F (36.8 C)     Temp Source 09/16/16 1016 Oral     SpO2 09/16/16 1016 100 %     Weight 09/16/16 1016 228 lb (103.4 kg)     Height --      Head Circumference --      Peak Flow --      Pain Score 09/16/16 1017 8     Pain Loc --      Pain Edu? --      Excl. in GC? --    No data found.   Updated Vital Signs BP 131/81 (BP Location: Left Arm)   Pulse 85   Temp 98.2 F (36.8 C) (Oral)   Resp 16   Wt 228 lb (103.4 kg)   SpO2 100%   BMI 40.39 kg/m   Visual Acuity Right Eye Distance:   Left Eye Distance:   Bilateral Distance:    Right Eye Near:   Left Eye Near:    Bilateral Near:     Physical Exam  Constitutional: She is oriented to person, place, and time. She appears well-developed and well-nourished.  HENT:  Head: Normocephalic and atraumatic.  Right Ear: External ear normal.  Left Ear: External ear normal.  Nose: Nose normal.  Mouth/Throat: Oropharynx is clear and moist.  Eyes: Pupils are equal, round, and reactive to light. Right conjunctiva has a hemorrhage.  Neck: Normal range of motion. Neck supple.  Pulmonary/Chest: Effort normal.  Abdominal: Soft. She exhibits distension.  Musculoskeletal: Normal range of motion.  Neurological: She is alert and oriented to person, place, and time.  Skin: Skin is  warm.  Psychiatric: She has a normal mood and affect.  Vitals reviewed.    UC Treatments / Results  Labs (all labs ordered are listed, but only abnormal results are displayed) Labs Reviewed  URINALYSIS, COMPLETE (UACMP) WITH MICROSCOPIC - Abnormal; Notable for the following:       Result Value   Hgb urine dipstick MODERATE (*)    Bilirubin Urine SMALL (*)    Protein, ur >300 (*)    Leukocytes, UA TRACE (*)    Squamous Epithelial / LPF 0-5 (*)    Bacteria, UA RARE (*)  All other components within normal limits  URINE CULTURE    EKG  EKG Interpretation None       Radiology No results found.  Procedures Procedures (including critical care time)  Medications Ordered in UC Medications - No data to display  Results for orders placed or performed during the hospital encounter of 09/16/16  Urinalysis, Complete w Microscopic  Result Value Ref Range   Color, Urine YELLOW YELLOW   APPearance CLEAR CLEAR   Specific Gravity, Urine 1.025 1.005 - 1.030   pH 5.5 5.0 - 8.0   Glucose, UA NEGATIVE NEGATIVE mg/dL   Hgb urine dipstick MODERATE (A) NEGATIVE   Bilirubin Urine SMALL (A) NEGATIVE   Ketones, ur NEGATIVE NEGATIVE mg/dL   Protein, ur >161 (A) NEGATIVE mg/dL   Nitrite NEGATIVE NEGATIVE   Leukocytes, UA TRACE (A) NEGATIVE   Squamous Epithelial / LPF 0-5 (A) NONE SEEN   WBC, UA 6-30 0 - 5 WBC/hpf   RBC / HPF 6-30 0 - 5 RBC/hpf   Bacteria, UA RARE (A) NONE SEEN   Urine-Other LESS THAN 10 mL OF URINE SUBMITTED    Initial Impression / Assessment and Plan / UC Course  I have reviewed the triage vital signs and the nursing notes.  Pertinent labs & imaging results that were available during my care of the patient were reviewed by me and considered in my medical decision making (see chart for details).   will place on Macrobid twice a day for week Diflucan now and repeat in a week if needed and Pyridium 200 mg 3 times a day for 5 days follow-up PCP 1-2 weeks for follow-up  and repeat urinalysis  Final Clinical Impressions(s) / UC Diagnoses   Final diagnoses:  Lower urinary tract infectious disease  Acute cystitis with hematuria    New Prescriptions New Prescriptions   FLUCONAZOLE (DIFLUCAN) 150 MG TABLET    Take 1 tablet (150 mg total) by mouth once.   NITROFURANTOIN, MACROCRYSTAL-MONOHYDRATE, (MACROBID) 100 MG CAPSULE    Take 1 capsule (100 mg total) by mouth 2 (two) times daily.   PHENAZOPYRIDINE (PYRIDIUM) 200 MG TABLET    Take 1 tablet (200 mg total) by mouth 3 (three) times daily as needed for pain.   Note: This dictation was prepared with Dragon dictation along with smaller phrase technology. Any transcriptional errors that result from this process are unintentional.  Controlled Substance Prescriptions [place] Controlled Substance Registry consulted? Not Applicable   Hassan Rowan, MD 09/16/16 1127    Hassan Rowan, MD 09/16/16 1134

## 2016-09-17 LAB — URINE CULTURE: Special Requests: NORMAL

## 2017-04-04 ENCOUNTER — Telehealth: Payer: Self-pay

## 2017-04-04 ENCOUNTER — Other Ambulatory Visit: Payer: Self-pay

## 2017-04-04 DIAGNOSIS — Z1211 Encounter for screening for malignant neoplasm of colon: Secondary | ICD-10-CM

## 2017-04-04 MED ORDER — PEG 3350-KCL-NABCB-NACL-NASULF 236 G PO SOLR: ORAL | 0 refills | Status: DC

## 2017-04-04 NOTE — Telephone Encounter (Signed)
Gastroenterology Pre-Procedure Review  Request Date:  Requesting Physician: Dr.   PATIENT REVIEW QUESTIONS: The patient responded to the following health history questions as indicated:    1. Are you having any GI issues? no 2. Do you have a personal history of Polyps? no 3. Do you have a family history of Colon Cancer or Polyps? no 4. Diabetes Mellitus? no 5. Joint replacements in the past 12 months?no 6. Major health problems in the past 3 months?no 7. Any artificial heart valves, MVP, or defibrillator?no    MEDICATIONS & ALLERGIES:    Patient reports the following regarding taking any anticoagulation/antiplatelet therapy:   Plavix, Coumadin, Eliquis, Xarelto, Lovenox, Pradaxa, Brilinta, or Effient? no Aspirin? no  Patient confirms/reports the following medications:  Current Outpatient Medications  Medication Sig Dispense Refill  . INVOKANA 100 MG TABS tablet Take 100 mg by mouth daily.  2  . levothyroxine (SYNTHROID, LEVOTHROID) 137 MCG tablet Take 137 mcg by mouth daily.  10  . lisinopril-hydrochlorothiazide (PRINZIDE,ZESTORETIC) 20-25 MG tablet Take 1 tablet by mouth daily.  4  . meloxicam (MOBIC) 15 MG tablet Take 1 tablet (15 mg total) by mouth daily as needed. 10 tablet 0  . nitrofurantoin, macrocrystal-monohydrate, (MACROBID) 100 MG capsule Take 1 capsule (100 mg total) by mouth 2 (two) times daily. 14 capsule 0  . NOVOLOG 100 UNIT/ML injection Inject 1 Dose into the skin as needed. Adjust insulin pump according to carb intake  2  . oxyCODONE-acetaminophen (ROXICET) 5-325 MG tablet Take 1 tablet by mouth every 4 (four) hours as needed for severe pain. 20 tablet 0  . pantoprazole (PROTONIX) 40 MG tablet Take 1 tablet (40 mg total) by mouth daily. 30 tablet 1  . phenazopyridine (PYRIDIUM) 200 MG tablet Take 1 tablet (200 mg total) by mouth 3 (three) times daily as needed for pain. 15 tablet 0  . pravastatin (PRAVACHOL) 40 MG tablet Take 40 mg by mouth daily.  2   No current  facility-administered medications for this visit.     Patient confirms/reports the following allergies:  Allergies  Allergen Reactions  . Morphine And Related Palpitations    No orders of the defined types were placed in this encounter.   AUTHORIZATION INFORMATION Primary Insurance: 1D#: Group #:  Secondary Insurance: 1D#: Group #:  SCHEDULE INFORMATION: Date: 04/15/17 Time: Location: [facility]

## 2017-04-04 NOTE — Progress Notes (Signed)
gol 

## 2017-04-15 ENCOUNTER — Ambulatory Visit
Admission: RE | Admit: 2017-04-15 | Discharge: 2017-04-15 | Disposition: A | Discharge status: Home / Self Care | Payer: BLUE CROSS/BLUE SHIELD | Source: Ambulatory Visit | Attending: Gastroenterology | Admitting: Gastroenterology

## 2017-04-15 ENCOUNTER — Encounter: Payer: Self-pay | Admitting: Anesthesiology

## 2017-04-15 ENCOUNTER — Ambulatory Visit: Payer: BLUE CROSS/BLUE SHIELD | Admitting: Anesthesiology

## 2017-04-15 ENCOUNTER — Encounter
Admission: RE | Disposition: A | Discharge status: Home / Self Care | Payer: Self-pay | Source: Ambulatory Visit | Attending: Gastroenterology

## 2017-04-15 DIAGNOSIS — J45909 Unspecified asthma, uncomplicated: Secondary | ICD-10-CM | POA: Diagnosis not present

## 2017-04-15 DIAGNOSIS — Z79899 Other long term (current) drug therapy: Secondary | ICD-10-CM | POA: Diagnosis not present

## 2017-04-15 DIAGNOSIS — E119 Type 2 diabetes mellitus without complications: Secondary | ICD-10-CM | POA: Insufficient documentation

## 2017-04-15 DIAGNOSIS — Z1211 Encounter for screening for malignant neoplasm of colon: Secondary | ICD-10-CM | POA: Insufficient documentation

## 2017-04-15 DIAGNOSIS — Z794 Long term (current) use of insulin: Secondary | ICD-10-CM | POA: Insufficient documentation

## 2017-04-15 DIAGNOSIS — I10 Essential (primary) hypertension: Secondary | ICD-10-CM | POA: Insufficient documentation

## 2017-04-15 DIAGNOSIS — Z833 Family history of diabetes mellitus: Secondary | ICD-10-CM | POA: Insufficient documentation

## 2017-04-15 DIAGNOSIS — Z90710 Acquired absence of both cervix and uterus: Secondary | ICD-10-CM | POA: Diagnosis not present

## 2017-04-15 DIAGNOSIS — Z9049 Acquired absence of other specified parts of digestive tract: Secondary | ICD-10-CM | POA: Diagnosis not present

## 2017-04-15 DIAGNOSIS — Z6841 Body Mass Index (BMI) 40.0 and over, adult: Secondary | ICD-10-CM | POA: Insufficient documentation

## 2017-04-15 HISTORY — PX: COLONOSCOPY WITH PROPOFOL: SHX5780

## 2017-04-15 LAB — GLUCOSE, CAPILLARY: GLUCOSE-CAPILLARY: 125 mg/dL — AB (ref 65–99)

## 2017-04-15 SURGERY — COLONOSCOPY WITH PROPOFOL
Anesthesia: General

## 2017-04-15 MED ORDER — PROPOFOL 500 MG/50ML IV EMUL
INTRAVENOUS | Status: DC | PRN
  Administered 2017-04-15: 140 ug/kg/min via INTRAVENOUS

## 2017-04-15 MED ORDER — LIDOCAINE HCL (PF) 1 % IJ SOLN
INTRAMUSCULAR | Status: AC
  Administered 2017-04-15: 0.3 mL
  Filled 2017-04-15: qty 2

## 2017-04-15 MED ORDER — PROPOFOL 10 MG/ML IV BOLUS
INTRAVENOUS | Status: AC
  Filled 2017-04-15: qty 20

## 2017-04-15 MED ORDER — PROPOFOL 10 MG/ML IV BOLUS
INTRAVENOUS | Status: DC | PRN
  Administered 2017-04-15: 70 mg via INTRAVENOUS
  Administered 2017-04-15: 30 mg via INTRAVENOUS

## 2017-04-15 MED ORDER — SODIUM CHLORIDE 0.9 % IV SOLN
INTRAVENOUS | Status: DC
  Administered 2017-04-15: 1000 mL via INTRAVENOUS

## 2017-04-15 NOTE — Anesthesia Preprocedure Evaluation (Addendum)
Anesthesia Evaluation  Patient identified by MRN, date of birth, ID band Patient awake    Reviewed: Allergy & Precautions, NPO status , Patient's Chart, lab work & pertinent test results, reviewed documented beta blocker date and time   Airway Mallampati: III  TM Distance: >3 FB     Dental  (+) Chipped   Pulmonary asthma ,           Cardiovascular hypertension, Pt. on medications      Neuro/Psych    GI/Hepatic   Endo/Other  diabetes, Type obesity  Renal/GU      Musculoskeletal   Abdominal   Peds  Hematology   Anesthesia Other Findings   Reproductive/Obstetrics                             Anesthesia Physical Anesthesia Plan  ASA: III  Anesthesia Plan: General   Post-op Pain Management:    Induction: Intravenous  PONV Risk Score and Plan:   Airway Management Planned:   Additional Equipment:   Intra-op Plan:   Post-operative Plan:   Informed Consent: I have reviewed the patients History and Physical, chart, labs and discussed the procedure including the risks, benefits and alternatives for the proposed anesthesia with the patient or authorized representative who has indicated his/her understanding and acceptance.     Plan Discussed with: CRNA  Anesthesia Plan Comments:         Anesthesia Quick Evaluation

## 2017-04-15 NOTE — Anesthesia Postprocedure Evaluation (Signed)
Anesthesia Post Note  Patient: Thereasa ParkinBonnie Chim  Procedure(s) Performed: COLONOSCOPY WITH PROPOFOL (N/A )  Patient location during evaluation: Endoscopy Anesthesia Type: General Level of consciousness: awake and alert Pain management: pain level controlled Vital Signs Assessment: post-procedure vital signs reviewed and stable Respiratory status: spontaneous breathing, nonlabored ventilation, respiratory function stable and patient connected to nasal cannula oxygen Cardiovascular status: blood pressure returned to baseline and stable Postop Assessment: no apparent nausea or vomiting Anesthetic complications: no     Last Vitals:  Vitals:   04/15/17 1034 04/15/17 1215  BP: (!) 145/80 128/70  Pulse: (!) 102 (!) 101  Resp: 17 (!) 26  Temp: 36.7 C (!) 36.3 C  SpO2: 100% 98%    Last Pain:  Vitals:   04/15/17 1215  TempSrc:   PainSc: 0-No pain                 Tao Satz S

## 2017-04-15 NOTE — Anesthesia Post-op Follow-up Note (Signed)
Anesthesia QCDR form completed.        

## 2017-04-15 NOTE — Op Note (Signed)
[facility] Gastroenterology Patient Name: Jennifer Lozano Procedure Date: 04/15/2017 11:39 AM MRN: [id number] Account #: [id number] Date of Birth: Apr 07, 1965 Admit Type: Outpatient Age: 52 Room: [facility] ENDO ROOM 2 Gender: Female Note Status: Finalized Procedure:            Colonoscopy Indications:          Screening for colorectal malignant neoplasm, This is                        the patient's first colonoscopy Providers:            Lin Landsman MD, MD Referring MD:         No Local Md, MD (Referring MD) Medicines:            Monitored Anesthesia Care Complications:        No immediate complications. Estimated blood loss: None. Procedure:            Pre-Anesthesia Assessment:                       - Prior to the procedure, a History and Physical was                        performed, and patient medications and allergies were                        reviewed. The patient is competent. The risks and                        benefits of the procedure and the sedation options and                        risks were discussed with the patient. All questions                        were answered and informed consent was obtained.                        Patient identification and proposed procedure were                        verified by the physician, the nurse, the                        anesthesiologist, the anesthetist and the technician in                        the pre-procedure area in the procedure room in the                        endoscopy suite. Mental Status Examination: alert and                        oriented. Airway Examination: normal oropharyngeal                        airway and neck mobility. Respiratory Examination:                        clear to auscultation. CV Examination: normal.  Prophylactic Antibiotics: The patient does not require                        prophylactic antibiotics. Prior Anticoagulants: The          patient has taken no previous anticoagulant or                        antiplatelet agents. ASA Grade Assessment: III - A                        patient with severe systemic disease. After reviewing                        the risks and benefits, the patient was deemed in                        satisfactory condition to undergo the procedure. The                        anesthesia plan was to use monitored anesthesia care                        (MAC). Immediately prior to administration of                        medications, the patient was re-assessed for adequacy                        to receive sedatives. The heart rate, respiratory rate,                        oxygen saturations, blood pressure, adequacy of                        pulmonary ventilation, and response to care were                        monitored throughout the procedure. The physical status                        of the patient was re-assessed after the procedure.                       After obtaining informed consent, the colonoscope was                        passed under direct vision. Throughout the procedure,                        the patient's blood pressure, pulse, and oxygen                        saturations were monitored continuously. The                        Colonoscope was introduced through the anus and                        advanced to the the terminal ileum. The colonoscopy was  performed with moderate difficulty due to inadequate                        bowel prep and the patient's body habitus. Successful                        completion of the procedure was aided by applying                        abdominal pressure, lavage and performing the maneuvers                        documented (below) in this report. The patient                        tolerated the procedure well. The quality of the bowel                        preparation was evaluated using the BBPS [facility] Bowel                         Preparation Scale) with scores of: Right Colon = 2                        (minor amount of residual staining, small fragments of                        stool and/or opaque liquid, but mucosa seen well),                        Transverse Colon = 2 (minor amount of residual                        staining, small fragments of stool and/or opaque                        liquid, but mucosa seen well) and Left Colon = 2 (minor                        amount of residual staining, small fragments of stool                        and/or opaque liquid, but mucosa seen well). The total                        BBPS score equals 6. The quality of the bowel                        preparation was fair. Findings:      The perianal and digital rectal examinations were normal. Pertinent       negatives include normal sphincter tone and no palpable rectal lesions.      The terminal ileum appeared normal.      A large amount of stool was found in the entire colon, precluding       visualization. Lavage of the area was performed using greater than 500       mL of sterile water, resulting in clearance with good visualization.      The  entire examined colon appeared normal. Impression:           - Preparation of the colon was good after lavage with                        large amounts of water.                       - The examined portion of the ileum was normal.                       - Stool in the entire examined colon.                       - The entire examined colon is normal.                       - No specimens collected. Recommendation:       - Discharge patient to home.                       - Resume previous diet today.                       - Continue present medications.                       - Repeat colonoscopy in 5 years because the bowel                        preparation was suboptimal. Procedure Code(s):    --- Professional ---                       D9242, Colorectal  cancer screening; colonoscopy on                        individual not meeting criteria for high risk Diagnosis Code(s):    --- Professional ---                       Z12.11, Encounter for screening for malignant neoplasm                        of colon CPT copyright 2017 American Medical Association. All rights reserved. The codes documented in this report are preliminary and upon coder review may  be revised to meet current compliance requirements. Dr. Ulyess Mort Lin Landsman MD, MD 04/15/2017 12:15:31 PM This report has been signed electronically. Number of Addenda: 0 Note Initiated On: 04/15/2017 11:39 AM Scope Withdrawal Time: 0 hours 22 minutes 41 seconds  Total Procedure Duration: 0 hours 27 minutes 8 seconds       [facility]

## 2017-04-15 NOTE — H&P (Signed)
Jennifer Repress, MD [address]  [address]  [place], [place] [address]  Main: [phone]  Fax: [phone] Pager: [phone]  Primary Care Physician:  Jennifer Round, NP Primary Gastroenterologist:  Dr. Arlyss Lozano  Pre-Procedure History & Physical: HPI:  Jennifer Lozano is a 52 y.o. female is here for an colonoscopy.   Past Medical History:  Diagnosis Date  . Asthma   . Diabetes mellitus without complication (HCC)   . Hypertension     Past Surgical History:  Procedure Laterality Date  . ABDOMINAL HYSTERECTOMY    . CHOLECYSTECTOMY    . TONSILLECTOMY      Prior to Admission medications   Medication Sig Start Date End Date Taking? Authorizing Provider  INVOKANA 100 MG TABS tablet Take 100 mg by mouth daily. 03/26/16   [provider]  levothyroxine (SYNTHROID, LEVOTHROID) 137 MCG tablet Take 137 mcg by mouth daily. 02/20/16   [provider]  lisinopril-hydrochlorothiazide (PRINZIDE,ZESTORETIC) 20-25 MG tablet Take 1 tablet by mouth daily. 03/26/16   [provider]  meloxicam (MOBIC) 15 MG tablet Take 1 tablet (15 mg total) by mouth daily as needed. 07/21/16   Renford Dills, NP  nitrofurantoin, macrocrystal-monohydrate, (MACROBID) 100 MG capsule Take 1 capsule (100 mg total) by mouth 2 (two) times daily. 09/16/16   Hassan Rowan, MD  NOVOLOG 100 UNIT/ML injection Inject 1 Dose into the skin as needed. Adjust insulin pump according to carb intake 03/15/16   [provider]  oxyCODONE-acetaminophen (ROXICET) 5-325 MG tablet Take 1 tablet by mouth every 4 (four) hours as needed for severe pain. 03/31/16   Darci Current, MD  pantoprazole (PROTONIX) 40 MG tablet Take 1 tablet (40 mg total) by mouth daily. 04/01/16 04/01/17  Arnaldo Natal, MD  phenazopyridine (PYRIDIUM) 200 MG tablet Take 1 tablet (200 mg total) by mouth 3 (three) times daily as needed for pain. 09/16/16   Hassan Rowan, MD  polyethylene glycol (GOLYTELY) 236 g solution Drink  one 8 oz glass every 20 mins until stools are clear starting at 5:00pm on 04/14/17 04/04/17   Toney Reil, MD  pravastatin (PRAVACHOL) 40 MG tablet Take 40 mg by mouth daily. 03/13/16   [provider]    Allergies as of 04/04/2017 - Review Complete 09/16/2016  Allergen Reaction Noted  . Morphine and related Palpitations 09/22/2014    Family History  Problem Relation Age of Onset  . Diabetes Sister   . Diabetes Brother     Social History   Socioeconomic History  . Marital status: Married    Spouse name: Not on file  . Number of children: Not on file  . Years of education: Not on file  . Highest education level: Not on file  Occupational History  . Not on file  Social Needs  . Financial resource strain: Not on file  . Food insecurity:    Worry: Not on file    Inability: Not on file  . Transportation needs:    Medical: Not on file    Non-medical: Not on file  Tobacco Use  . Smoking status: Never Smoker  . Smokeless tobacco: Never Used  Substance and Sexual Activity  . Alcohol use: No  . Drug use: No  . Sexual activity: Not on file  Lifestyle  . Physical activity:    Days per week: Not on file    Minutes per session: Not on file  . Stress: Not on file  Relationships  . Social connections:  Talks on phone: Not on file    Gets together: Not on file    Attends religious service: Not on file    Active member of club or organization: Not on file    Attends meetings of clubs or organizations: Not on file    Relationship status: Not on file  . Intimate partner violence:    Fear of current or ex partner: Not on file    Emotionally abused: Not on file    Physically abused: Not on file    Forced sexual activity: Not on file  Other Topics Concern  . Not on file  Social History Narrative  . Not on file    Review of Systems: See HPI, otherwise negative ROS  Physical Exam: BP (!) 145/80   Pulse (!) 102   Temp 98.1 F (36.7 C) (Tympanic)   Resp 17    Ht 5\' 2"  (1.575 m)   Wt 228 lb (103.4 kg)   SpO2 100%   BMI 41.70 kg/m  General:   Alert,  pleasant and cooperative in NAD Head:  Normocephalic and atraumatic. Neck:  Supple; no masses or thyromegaly. Lungs:  Clear throughout to auscultation.    Heart:  Regular rate and rhythm. Abdomen:  Soft, nontender and nondistended. Normal bowel sounds, without guarding, and without rebound.   Neurologic:  Alert and  oriented x4;  grossly normal neurologically.  Impression/Plan: Jennifer Lozano is here for an colonoscopy to be performed for colon cancer screening  Risks, benefits, limitations, and alternatives regarding  colonoscopy have been reviewed with the patient.  Questions have been answered.  All parties agreeable.   Jennifer Donathohini Syrus Nakama, MD  04/15/2017, 10:56 AM

## 2017-04-15 NOTE — Transfer of Care (Signed)
Immediate Anesthesia Transfer of Care Note  Patient: Thereasa ParkinBonnie Brys  Procedure(s) Performed: COLONOSCOPY WITH PROPOFOL (N/A )  Patient Location: PACU  Anesthesia Type:General  Level of Consciousness: sedated  Airway & Oxygen Therapy: Patient Spontanous Breathing and Patient connected to nasal cannula oxygen  Post-op Assessment: Report given to RN and Post -op Vital signs reviewed and stable  Post vital signs: Reviewed and stable  Last Vitals:  Vitals Value Taken Time  BP 128/70 04/15/2017 12:15 PM  Temp    Pulse 103 04/15/2017 12:16 PM  Resp 27 04/15/2017 12:16 PM  SpO2 98 % 04/15/2017 12:16 PM  Vitals shown include unvalidated device data.  Last Pain:  Vitals:   04/15/17 1034  TempSrc: Tympanic  PainSc: 0-No pain         Complications: No apparent anesthesia complications

## 2017-04-16 ENCOUNTER — Encounter: Payer: Self-pay | Admitting: Gastroenterology

## 2017-04-18 ENCOUNTER — Telehealth: Payer: Self-pay | Admitting: Gastroenterology

## 2017-04-18 NOTE — Telephone Encounter (Signed)
Patient had a colonoscopy on Monday 04/15/17 and is still having cramping. Is this normal? Please call patient.

## 2017-04-19 NOTE — Telephone Encounter (Signed)
Patient states that she is experiencing cramping after she eats.  Feels like menstrual cramps on pain scale 1-10 rates the pain as a 5.  She has not had a bowel movement since her colonoscopy 04/15/17. Discussed with Dr. Allegra LaiVanga.  Per Dr. Allegra LaiVanga, patient has been advised to increase her fiber, add fiber supplementation suggested she picks up benefiber.  Cramping could be coming from constipation.  Informed patient that if her cramping does not improve to contact our office next week to let us know.  Thanks [employer]

## 2017-04-27 ENCOUNTER — Emergency Department
Admission: EM | Admit: 2017-04-27 | Discharge: 2017-04-27 | Disposition: A | Discharge status: Home / Self Care | Payer: BLUE CROSS/BLUE SHIELD | Attending: Emergency Medicine | Admitting: Emergency Medicine

## 2017-04-27 ENCOUNTER — Encounter: Payer: Self-pay | Admitting: Emergency Medicine

## 2017-04-27 ENCOUNTER — Other Ambulatory Visit: Payer: Self-pay

## 2017-04-27 ENCOUNTER — Emergency Department: Payer: BLUE CROSS/BLUE SHIELD

## 2017-04-27 DIAGNOSIS — I10 Essential (primary) hypertension: Secondary | ICD-10-CM | POA: Diagnosis not present

## 2017-04-27 DIAGNOSIS — Z79899 Other long term (current) drug therapy: Secondary | ICD-10-CM | POA: Insufficient documentation

## 2017-04-27 DIAGNOSIS — E119 Type 2 diabetes mellitus without complications: Secondary | ICD-10-CM | POA: Insufficient documentation

## 2017-04-27 DIAGNOSIS — K5900 Constipation, unspecified: Secondary | ICD-10-CM

## 2017-04-27 DIAGNOSIS — J45909 Unspecified asthma, uncomplicated: Secondary | ICD-10-CM | POA: Diagnosis not present

## 2017-04-27 LAB — URINALYSIS, COMPLETE (UACMP) WITH MICROSCOPIC
BILIRUBIN URINE: NEGATIVE
GLUCOSE, UA: NEGATIVE mg/dL
HGB URINE DIPSTICK: NEGATIVE
Ketones, ur: NEGATIVE mg/dL
LEUKOCYTES UA: NEGATIVE
NITRITE: NEGATIVE
PH: 9 — AB (ref 5.0–8.0)
Protein, ur: NEGATIVE mg/dL
SPECIFIC GRAVITY, URINE: 1.009 (ref 1.005–1.030)

## 2017-04-27 LAB — COMPREHENSIVE METABOLIC PANEL
ALBUMIN: 3.9 g/dL (ref 3.5–5.0)
ALK PHOS: 100 U/L (ref 38–126)
ALT: 21 U/L (ref 14–54)
ANION GAP: 4 — AB (ref 5–15)
AST: 22 U/L (ref 15–41)
BILIRUBIN TOTAL: 0.6 mg/dL (ref 0.3–1.2)
BUN: 21 mg/dL — AB (ref 6–20)
CALCIUM: 8.9 mg/dL (ref 8.9–10.3)
CO2: 30 mmol/L (ref 22–32)
Chloride: 102 mmol/L (ref 101–111)
Creatinine, Ser: 1.14 mg/dL — ABNORMAL HIGH (ref 0.44–1.00)
GFR calc Af Amer: >60 mL/min (ref >60)
GFR, EST NON AFRICAN AMERICAN: 54 mL/min — AB (ref >60)
GLUCOSE: 166 mg/dL — AB (ref 65–99)
POTASSIUM: 4.3 mmol/L (ref 3.5–5.1)
Sodium: 136 mmol/L (ref 135–145)
TOTAL PROTEIN: 8.3 g/dL — AB (ref 6.5–8.1)

## 2017-04-27 LAB — CBC
HCT: 41 % (ref 35.0–47.0)
Hemoglobin: 14.1 g/dL (ref 12.0–16.0)
MCH: 30.6 pg (ref 26.0–34.0)
MCHC: 34.3 g/dL (ref 32.0–36.0)
MCV: 89.4 fL (ref 80.0–100.0)
Platelets: 273 10*3/uL (ref 150–440)
RBC: 4.59 MIL/uL (ref 3.80–5.20)
RDW: 12.7 % (ref 11.5–14.5)
WBC: 8.6 10*3/uL (ref 3.6–11.0)

## 2017-04-27 LAB — LIPASE, BLOOD: Lipase: 29 U/L (ref 11–51)

## 2017-04-27 MED ORDER — POLYETHYLENE GLYCOL 3350 17 G PO PACK: 17.0000 g | PACK | Freq: Two times a day (BID) | ORAL | 0 refills | Status: DC

## 2017-04-27 MED ORDER — LINACLOTIDE 145 MCG PO CAPS: 145.0000 ug | ORAL_CAPSULE | Freq: Every day | ORAL | 0 refills | Status: DC

## 2017-04-27 NOTE — ED Notes (Signed)

## 2017-04-27 NOTE — Discharge Instructions (Signed)
Would prefer not to have manual disimpaction or other procedures in the emergency room at this time.  If you change your mind or you feel worse in any way return to the emergency room this includes increased pain, vomiting, fever, the patient.  Please try the medication as commended by GI and follow-up with GI and your primary care in the next couple days.  Do note that there are some side effects to Linzess as there are 2 all medications please review it with the pharmacist prior to taking.  The GI doctor does recommend that you try MiraLAX 3 times a day as well.  Drink plenty of fluids eat a high-fiber diet

## 2017-04-27 NOTE — ED Triage Notes (Signed)
STates had a colonoscopy 2 weeks ago and has been unable to move bowels since.  States has taken enemas and oral medications with small results.  C.O intermittent abdominal cramping.

## 2017-04-27 NOTE — ED Provider Notes (Addendum)
[facility] Emergency Department Provider Note  ____________________________________________   I have reviewed the triage vital signs and the nursing notes. Where available I have reviewed prior notes and, if possible and indicated, outside hospital notes.    HISTORY  Chief Complaint Constipation    HPI Jennifer Lozano is a 52 y.o. female patient has been diabetes, constipation in the past she states presents today after having had a colonoscopy about 12 days ago.  She has been having bowel movements since then but not satisfying ones and she is worried about it.  She has occasional cramping no pain no vomiting.  She tried Dulcolax and some MiraLAX and tried fleets enemas with no success to get a satisfying bowel movement.  She denies any significant abdominal pain or fever, she is is worried that she is not moving her bowels enough.  Patient's colonoscopy was normal and I am able to look back at it.  Patient unfortunately did not have a successful bowel prep and had extensive bowel lavage at that time to allow her to have colonoscopy performed.  And since that time she has had an significant amount of stool.  She is tolerating p.o. with no difficulty and she denies any other complaints or symptoms  Did suggest perhaps another bowel prep which she adamantly refuses stating that she does not like "that stuff"  Past Medical History:  Diagnosis Date  . Asthma   . Diabetes mellitus without complication (HCC)   . Hypertension     Patient Active Problem List   Diagnosis Date Noted  . Screen for colon cancer     Past Surgical History:  Procedure Laterality Date  . ABDOMINAL HYSTERECTOMY    . CHOLECYSTECTOMY    . COLONOSCOPY WITH PROPOFOL N/A 04/15/2017   Procedure: COLONOSCOPY WITH PROPOFOL;  Surgeon: Toney Reil, MD;  Location: [facility] ENDOSCOPY;  Service: Gastroenterology;  Laterality: N/A;  . TONSILLECTOMY      Prior to Admission medications   Medication  Sig Start Date End Date Taking? Authorizing Provider  levothyroxine (SYNTHROID, LEVOTHROID) 125 MCG tablet Take 125 mcg by mouth daily. 04/25/17  Yes [provider]  lisinopril-hydrochlorothiazide (PRINZIDE,ZESTORETIC) 20-25 MG tablet Take 1 tablet by mouth daily. 03/26/16  Yes [provider]  NOVOLOG 100 UNIT/ML injection Inject 1 Dose into the skin as needed. Adjust insulin pump according to carb intake 03/15/16  Yes [provider]  polyethylene glycol (MIRALAX / GLYCOLAX) packet Take 17 g by mouth 2 (two) times daily.   Yes [provider]  meloxicam (MOBIC) 15 MG tablet Take 1 tablet (15 mg total) by mouth daily as needed. Patient not taking: Reported on 04/27/2017 07/21/16   Renford Dills, NP  nitrofurantoin, macrocrystal-monohydrate, (MACROBID) 100 MG capsule Take 1 capsule (100 mg total) by mouth 2 (two) times daily. Patient not taking: Reported on 04/27/2017 09/16/16   Hassan Rowan, MD  oxyCODONE-acetaminophen (ROXICET) 5-325 MG tablet Take 1 tablet by mouth every 4 (four) hours as needed for severe pain. Patient not taking: Reported on 04/27/2017 03/31/16   Darci Current, MD  pantoprazole (PROTONIX) 40 MG tablet Take 1 tablet (40 mg total) by mouth daily. Patient not taking: Reported on 04/27/2017 04/01/16 04/01/17  Arnaldo Natal, MD  phenazopyridine (PYRIDIUM) 200 MG tablet Take 1 tablet (200 mg total) by mouth 3 (three) times daily as needed for pain. Patient not taking: Reported on 04/27/2017 09/16/16   Hassan Rowan, MD  polyethylene glycol (GOLYTELY) 236 g solution Drink one 8  oz glass every 20 mins until stools are clear starting at 5:00pm on 04/14/17 Patient not taking: Reported on 04/27/2017 04/04/17   Toney ReilVanga, Rohini Reddy, MD    Allergies Morphine and related  Family History  Problem Relation Age of Onset  . Diabetes Sister   . Diabetes Brother     Social History Social History   Tobacco Use  . Smoking status: Never Smoker  . Smokeless  tobacco: Never Used  Substance Use Topics  . Alcohol use: No  . Drug use: No    Review of Systems Constitutional: No fever/chills Eyes: No visual changes. ENT: No sore throat. No stiff neck no neck pain Cardiovascular: Denies chest pain. Respiratory: Denies shortness of breath. Gastrointestinal:   no vomiting.  No diarrhea.  + constipation. Genitourinary: Negative for dysuria. Musculoskeletal: Negative lower extremity swelling Skin: Negative for rash. Neurological: Negative for severe headaches, focal weakness or numbness.   ____________________________________________   PHYSICAL EXAM:  VITAL SIGNS: ED Triage Vitals  Enc Vitals Group     BP 04/27/17 1105 (!) 151/69     Pulse Rate 04/27/17 1105 (!) 102     Resp 04/27/17 1105 16     Temp 04/27/17 1105 97.6 F (36.4 C)     Temp Source 04/27/17 1105 Oral     SpO2 04/27/17 1105 99 %     Weight 04/27/17 1041 230 lb (104.3 kg)     Height 04/27/17 1041 5\' 6"  (1.676 m)     Head Circumference --      Peak Flow --      Pain Score 04/27/17 1041 0     Pain Loc --      Pain Edu? --      Excl. in GC? --     Constitutional: Alert and oriented. Well appearing and in no acute distress. Eyes: Conjunctivae are normal Head: Atraumatic HEENT: No congestion/rhinnorhea. Mucous membranes are moist.  Oropharynx non-erythematous Neck:   Nontender with no meningismus, no masses, no stridor Cardiovascular: Normal rate, regular rhythm. Grossly normal heart sounds.  Good peripheral circulation. Respiratory: Normal respiratory effort.  No retractions. Lungs CTAB. Abdominal: Soft and nontender. No distention. No guarding no rebound Back:  There is no focal tenderness or step off.  there is no midline tenderness there are no lesions noted. there is no CVA tenderness Musculoskeletal: No lower extremity tenderness, no upper extremity tenderness. No joint effusions, no DVT signs strong distal pulses no edema Neurologic:  Normal speech and language.  No gross focal neurologic deficits are appreciated.  Skin:  Skin is warm, dry and intact. No rash noted. Psychiatric: Mood and affect are normal. Speech and behavior are normal.  ____________________________________________   LABS (all labs ordered are listed, but only abnormal results are displayed)  Labs Reviewed  COMPREHENSIVE METABOLIC PANEL - Abnormal; Notable for the following components:      Result Value   Glucose, Bld 166 (*)    BUN 21 (*)    Creatinine, Ser 1.14 (*)    Total Protein 8.3 (*)    GFR calc non Af Amer 54 (*)    Anion gap 4 (*)    All other components within normal limits  URINALYSIS, COMPLETE (UACMP) WITH MICROSCOPIC - Abnormal; Notable for the following components:   Color, Urine YELLOW (*)    APPearance CLEAR (*)    pH 9.0 (*)    Bacteria, UA RARE (*)    Squamous Epithelial / LPF 0-5 (*)    All other components within  normal limits  LIPASE, BLOOD  CBC    Pertinent labs  results that were available during my care of the patient were reviewed by me and considered in my medical decision making (see chart for details). ____________________________________________  EKG  I personally interpreted any EKGs ordered by me or triage  ____________________________________________  RADIOLOGY  Pertinent labs & imaging results that were available during my care of the patient were reviewed by me and considered in my medical decision making (see chart for details). If possible, patient and/or family made aware of any abnormal findings.  No results found. ____________________________________________    PROCEDURES  Procedure(s) performed: None  Procedures  Critical Care performed: None  ____________________________________________   INITIAL IMPRESSION / ASSESSMENT AND PLAN / ED COURSE  Pertinent labs & imaging results that were available during my care of the patient were reviewed by me and considered in my medical decision making (see chart for  details).  Patient here with 12 days after whole bowel irrigation essentially with decreased stooling which is concerning to her.  White count is normal blood work is reassuring, she is in no acute distress abdomen is benign will obtain an x-ray to see if there is significant stool burden, patient is passing stool, this is not a satisfying quality.  She may need lactulose or other assistance and follow-up with GI we will evaluate closely given abdominal exam and length of symptoms I have low suspicion for obstruction or perforation etc.  ----------------------------------------- 1:57 PM on 04/27/2017 -----------------------------------------  D/w Dr. Allegra Lai, who did the colonoscopy.  She recommends disimpaction if possible and Linzess which we will prescribed after GI input.  ----------------------------------------- 2:06 PM on 04/27/2017 -----------------------------------------  Discussed with patient, we had her ready for a possible disimpaction but she refuses rectal exam.  She states that she would rather try medications at home.  No evidence of obstruction she is eager to go home and she understands that without rectal exam is a limited what I can do for her.  Prefer not to have them at this time either.  We will discharge her therefore with close outpatient follow-up and will start Linzess per GI    ____________________________________________   FINAL CLINICAL IMPRESSION(S) / ED DIAGNOSES  Final diagnoses:  None      This chart was dictated using voice recognition software.  Despite best efforts to proofread,  errors can occur which can change meaning.      Jeanmarie Plant, MD 04/27/17 1254    Jeanmarie Plant, MD 04/27/17 1403    Jeanmarie Plant, MD 04/27/17 [phone]

## 2017-05-08 ENCOUNTER — Ambulatory Visit: Payer: BLUE CROSS/BLUE SHIELD | Admitting: Gastroenterology

## 2017-07-08 DIAGNOSIS — E1142 Type 2 diabetes mellitus with diabetic polyneuropathy: Secondary | ICD-10-CM | POA: Diagnosis present

## 2017-09-24 ENCOUNTER — Other Ambulatory Visit: Payer: Self-pay | Admitting: Family Medicine

## 2017-09-24 DIAGNOSIS — Z1231 Encounter for screening mammogram for malignant neoplasm of breast: Secondary | ICD-10-CM

## 2017-12-20 DIAGNOSIS — J4520 Mild intermittent asthma, uncomplicated: Secondary | ICD-10-CM | POA: Insufficient documentation

## 2018-08-19 DIAGNOSIS — E119 Type 2 diabetes mellitus without complications: Secondary | ICD-10-CM

## 2018-10-01 ENCOUNTER — Other Ambulatory Visit: Payer: Self-pay

## 2018-10-01 ENCOUNTER — Encounter
Admission: RE | Admit: 2018-10-01 | Discharge: 2018-10-01 | Disposition: A | Discharge status: Home / Self Care | Payer: BLUE CROSS/BLUE SHIELD | Source: Ambulatory Visit | Attending: Surgery | Admitting: Surgery

## 2018-10-01 HISTORY — DX: Nausea with vomiting, unspecified: R11.2

## 2018-10-01 HISTORY — DX: Other specified postprocedural states: Z98.890

## 2018-10-01 NOTE — Patient Instructions (Signed)
Your procedure is scheduled on: 10/08/2018 Wed Report to Same Day Surgery 2nd floor medical mall [facility] Entrance-take elevator on left to 2nd floor.  Check in with surgery information desk.) To find out your arrival time please call [phone] between 1PM - 3PM on 10/07/2018 Tues  Remember: Instructions that are not followed completely may result in serious medical risk, up to and including death, or upon the discretion of your surgeon and anesthesiologist your surgery may need to be rescheduled.    _x___ 1. Do not eat food after midnight the night before your procedure. You may drink clear liquids up to 2 hours before you are scheduled to arrive at the hospital for your procedure.  Do not drink clear liquids within 2 hours of your scheduled arrival to the hospital.  Clear liquids include  --Water or Apple juice without pulp  --Clear carbohydrate beverage such as ClearFast or Gatorade  --Black Coffee or Clear Tea (No milk, no creamers, do not add anything to                  the coffee or Tea Type 1 and type 2 diabetics should only drink water.   ____Ensure clear carbohydrate drink on the way to the hospital for bariatric patients  ____Ensure clear carbohydrate drink 3 hours before surgery.   No gum chewing or hard candies.     __x__ 2. No Alcohol for 24 hours before or after surgery.   __x__3. No Smoking or e-cigarettes for 24 prior to surgery.  Do not use any chewable tobacco products for at least 6 hour prior to surgery   ____  4. Bring all medications with you on the day of surgery if instructed.    __x__ 5. Notify your doctor if there is any change in your medical condition     (cold, fever, infections).    x___6. On the morning of surgery brush your teeth with toothpaste and water.  You may rinse your mouth with mouth wash if you wish.  Do not swallow any toothpaste or mouthwash.   Do not wear jewelry, make-up, hairpins, clips or nail polish.  Do not wear lotions,  powders, or perfumes. You may wear deodorant.  Do not shave 48 hours prior to surgery. Men may shave face and neck.  Do not bring valuables to the hospital.    [facility] is not responsible for any belongings or valuables.               Contacts, dentures or bridgework may not be worn into surgery.  Leave your suitcase in the car. After surgery it may be brought to your room.  For patients admitted to the hospital, discharge time is determined by your                       treatment team.  _  Patients discharged the day of surgery will not be allowed to drive home.  You will need someone to drive you home and stay with you the night of your procedure.    Please read over the following fact sheets that you were given:   [facility] Preparing for Surgery and or MRSA Information   _x___ Take anti-hypertensive listed below, cardiac, seizure, asthma,     anti-reflux and psychiatric medicines. These include:  1. levothyroxine (SYNTHROID, LEVOTHROID) 125 MCG tablet  2.  3.  4.  5.  6.  ____Fleets enema or Magnesium Citrate as directed.  _x___ Use CHG Soap or sage wipes as directed on instruction sheet   ____ Use inhalers on the day of surgery and bring to hospital day of surgery  ____ Stop Metformin and Janumet 2 days prior to surgery.    ____ Take 1/2 of usual insulin dose the night before surgery and none on the morning     surgery.   _x___ Follow recommendations from Cardiologist, Pulmonologist or PCP regarding          stopping Aspirin, Coumadin, Plavix ,Eliquis, Effient, or Pradaxa, and Pletal.  X____Stop Anti-inflammatories such as Advil, Aleve, Ibuprofen, Motrin, Naproxen, Naprosyn, Goodies powders or aspirin products. OK to take Tylenol and                          Celebrex.   _x___ Stop supplements until after surgery.  But may continue Vitamin D, Vitamin B,       and multivitamin.   ____ Bring C-Pap to the hospital.    

## 2018-10-03 ENCOUNTER — Other Ambulatory Visit: Payer: BLUE CROSS/BLUE SHIELD

## 2018-10-03 ENCOUNTER — Encounter
Admission: RE | Admit: 2018-10-03 | Discharge: 2018-10-03 | Disposition: A | Discharge status: Home / Self Care | Payer: BLUE CROSS/BLUE SHIELD | Source: Ambulatory Visit | Attending: Surgery | Admitting: Surgery

## 2018-10-03 ENCOUNTER — Other Ambulatory Visit: Payer: Self-pay

## 2018-10-03 DIAGNOSIS — Z20828 Contact with and (suspected) exposure to other viral communicable diseases: Secondary | ICD-10-CM | POA: Insufficient documentation

## 2018-10-03 DIAGNOSIS — Z01812 Encounter for preprocedural laboratory examination: Secondary | ICD-10-CM | POA: Diagnosis not present

## 2018-10-03 DIAGNOSIS — E119 Type 2 diabetes mellitus without complications: Secondary | ICD-10-CM | POA: Diagnosis not present

## 2018-10-03 DIAGNOSIS — G5601 Carpal tunnel syndrome, right upper limb: Secondary | ICD-10-CM | POA: Insufficient documentation

## 2018-10-03 LAB — COMPREHENSIVE METABOLIC PANEL
ALT: 28 U/L (ref 0–44)
AST: 26 U/L (ref 15–41)
Albumin: 3.8 g/dL (ref 3.5–5.0)
Alkaline Phosphatase: 105 U/L (ref 38–126)
Anion gap: 8 (ref 5–15)
BUN: 30 mg/dL — ABNORMAL HIGH (ref 6–20)
CO2: 27 mmol/L (ref 22–32)
Calcium: 8.9 mg/dL (ref 8.9–10.3)
Chloride: 103 mmol/L (ref 98–111)
Creatinine, Ser: 1.21 mg/dL — ABNORMAL HIGH (ref 0.44–1.00)
GFR calc Af Amer: 58 mL/min — ABNORMAL LOW (ref >60)
GFR calc non Af Amer: 50 mL/min — ABNORMAL LOW (ref >60)
Glucose, Bld: 189 mg/dL — ABNORMAL HIGH (ref 70–99)
Potassium: 3.6 mmol/L (ref 3.5–5.1)
Sodium: 138 mmol/L (ref 135–145)
Total Bilirubin: 0.5 mg/dL (ref 0.3–1.2)
Total Protein: 7.8 g/dL (ref 6.5–8.1)

## 2018-10-03 LAB — CBC WITH DIFFERENTIAL/PLATELET
Abs Immature Granulocytes: 0.03 10*3/uL (ref 0.00–0.07)
Basophils Absolute: 0.1 10*3/uL (ref 0.0–0.1)
Basophils Relative: 1 %
Eosinophils Absolute: 0.3 10*3/uL (ref 0.0–0.5)
Eosinophils Relative: 3 %
HCT: 40.4 % (ref 36.0–46.0)
Hemoglobin: 13.4 g/dL (ref 12.0–15.0)
Immature Granulocytes: 0 %
Lymphocytes Relative: 30 %
Lymphs Abs: 2.9 10*3/uL (ref 0.7–4.0)
MCH: 30.2 pg (ref 26.0–34.0)
MCHC: 33.2 g/dL (ref 30.0–36.0)
MCV: 91 fL (ref 80.0–100.0)
Monocytes Absolute: 0.9 10*3/uL (ref 0.1–1.0)
Monocytes Relative: 9 %
Neutro Abs: 5.8 10*3/uL (ref 1.7–7.7)
Neutrophils Relative %: 57 %
Platelets: 263 10*3/uL (ref 150–400)
RBC: 4.44 MIL/uL (ref 3.87–5.11)
RDW: 12.8 % (ref 11.5–15.5)
WBC: 10 10*3/uL (ref 4.0–10.5)
nRBC: 0 % (ref 0.0–0.2)

## 2018-10-03 LAB — SARS CORONAVIRUS 2 (TAT 6-24 HRS): SARS Coronavirus 2: NEGATIVE

## 2018-10-08 ENCOUNTER — Encounter: Payer: Self-pay | Admitting: *Deleted

## 2018-10-08 ENCOUNTER — Ambulatory Visit: Payer: BLUE CROSS/BLUE SHIELD | Admitting: Anesthesiology

## 2018-10-08 ENCOUNTER — Encounter
Admission: RE | Disposition: A | Discharge status: Home / Self Care | Payer: Self-pay | Source: Home / Self Care | Attending: Surgery

## 2018-10-08 ENCOUNTER — Ambulatory Visit
Admission: RE | Admit: 2018-10-08 | Discharge: 2018-10-08 | Disposition: A | Discharge status: Home / Self Care | Payer: BLUE CROSS/BLUE SHIELD | Attending: Surgery | Admitting: Surgery

## 2018-10-08 ENCOUNTER — Other Ambulatory Visit: Payer: Self-pay

## 2018-10-08 DIAGNOSIS — Z794 Long term (current) use of insulin: Secondary | ICD-10-CM | POA: Insufficient documentation

## 2018-10-08 DIAGNOSIS — I10 Essential (primary) hypertension: Secondary | ICD-10-CM | POA: Diagnosis not present

## 2018-10-08 DIAGNOSIS — M654 Radial styloid tenosynovitis [de Quervain]: Secondary | ICD-10-CM | POA: Diagnosis not present

## 2018-10-08 DIAGNOSIS — E1142 Type 2 diabetes mellitus with diabetic polyneuropathy: Secondary | ICD-10-CM | POA: Diagnosis not present

## 2018-10-08 DIAGNOSIS — J45909 Unspecified asthma, uncomplicated: Secondary | ICD-10-CM | POA: Diagnosis not present

## 2018-10-08 DIAGNOSIS — G5601 Carpal tunnel syndrome, right upper limb: Secondary | ICD-10-CM | POA: Insufficient documentation

## 2018-10-08 DIAGNOSIS — Z79890 Hormone replacement therapy (postmenopausal): Secondary | ICD-10-CM | POA: Diagnosis not present

## 2018-10-08 DIAGNOSIS — E785 Hyperlipidemia, unspecified: Secondary | ICD-10-CM | POA: Diagnosis not present

## 2018-10-08 DIAGNOSIS — E039 Hypothyroidism, unspecified: Secondary | ICD-10-CM | POA: Insufficient documentation

## 2018-10-08 DIAGNOSIS — Z79899 Other long term (current) drug therapy: Secondary | ICD-10-CM | POA: Insufficient documentation

## 2018-10-08 HISTORY — PX: DORSAL COMPARTMENT RELEASE: SHX5039

## 2018-10-08 HISTORY — PX: CARPAL TUNNEL RELEASE: SHX101

## 2018-10-08 LAB — GLUCOSE, CAPILLARY
Glucose-Capillary: 132 mg/dL — ABNORMAL HIGH (ref 70–99)
Glucose-Capillary: 139 mg/dL — ABNORMAL HIGH (ref 70–99)
Glucose-Capillary: 127 mg/dL — ABNORMAL HIGH (ref 70–99)

## 2018-10-08 SURGERY — RELEASE, CARPAL TUNNEL, ENDOSCOPIC
Anesthesia: General | Site: Wrist | Laterality: Right

## 2018-10-08 MED ORDER — ONDANSETRON HCL 4 MG/2ML IJ SOLN
INTRAMUSCULAR | Status: AC
  Filled 2018-10-08: qty 2

## 2018-10-08 MED ORDER — SODIUM CHLORIDE 0.9 % IV SOLN
INTRAVENOUS | Status: DC
  Administered 2018-10-08: 08:00:00 via INTRAVENOUS

## 2018-10-08 MED ORDER — BUPIVACAINE HCL (PF) 0.5 % IJ SOLN
INTRAMUSCULAR | Status: AC
  Filled 2018-10-08: qty 30

## 2018-10-08 MED ORDER — ONDANSETRON HCL 4 MG/2ML IJ SOLN: 4.0000 mg | Freq: Once | INTRAMUSCULAR | Status: DC | PRN

## 2018-10-08 MED ORDER — MIDAZOLAM HCL 2 MG/2ML IJ SOLN
INTRAMUSCULAR | Status: AC
  Filled 2018-10-08: qty 2

## 2018-10-08 MED ORDER — LIDOCAINE HCL (CARDIAC) PF 100 MG/5ML IV SOSY
PREFILLED_SYRINGE | INTRAVENOUS | Status: DC | PRN
  Administered 2018-10-08: 100 mg via INTRATRACHEAL

## 2018-10-08 MED ORDER — HYDROCODONE-ACETAMINOPHEN 5-325 MG PO TABS
ORAL_TABLET | ORAL | Status: AC
  Administered 2018-10-08: 13:00:00 1 via ORAL
  Filled 2018-10-08: qty 1

## 2018-10-08 MED ORDER — ONDANSETRON HCL 4 MG/2ML IJ SOLN
INTRAMUSCULAR | Status: DC | PRN
  Administered 2018-10-08: 4 mg via INTRAVENOUS

## 2018-10-08 MED ORDER — FAMOTIDINE 20 MG PO TABS
20.0000 mg | ORAL_TABLET | Freq: Once | ORAL | Status: AC
  Administered 2018-10-08: 08:00:00 20 mg via ORAL

## 2018-10-08 MED ORDER — DEXAMETHASONE SODIUM PHOSPHATE 10 MG/ML IJ SOLN
INTRAMUSCULAR | Status: AC
  Filled 2018-10-08: qty 1

## 2018-10-08 MED ORDER — PROPOFOL 10 MG/ML IV BOLUS
INTRAVENOUS | Status: AC
  Filled 2018-10-08: qty 20

## 2018-10-08 MED ORDER — FENTANYL CITRATE (PF) 100 MCG/2ML IJ SOLN
INTRAMUSCULAR | Status: AC
  Administered 2018-10-08: 12:00:00 25 ug via INTRAVENOUS
  Filled 2018-10-08: qty 2

## 2018-10-08 MED ORDER — PROPOFOL 10 MG/ML IV BOLUS
INTRAVENOUS | Status: DC | PRN
  Administered 2018-10-08: 160 mg via INTRAVENOUS

## 2018-10-08 MED ORDER — FENTANYL CITRATE (PF) 100 MCG/2ML IJ SOLN
INTRAMUSCULAR | Status: DC | PRN
  Administered 2018-10-08: 25 ug via INTRAVENOUS
  Administered 2018-10-08: 50 ug via INTRAVENOUS
  Administered 2018-10-08: 25 ug via INTRAVENOUS

## 2018-10-08 MED ORDER — BUPIVACAINE HCL (PF) 0.5 % IJ SOLN
INTRAMUSCULAR | Status: DC | PRN
  Administered 2018-10-08: 10 mL

## 2018-10-08 MED ORDER — FENTANYL CITRATE (PF) 100 MCG/2ML IJ SOLN
25.0000 ug | INTRAMUSCULAR | Status: DC | PRN
  Administered 2018-10-08 (×4): 25 ug via INTRAVENOUS

## 2018-10-08 MED ORDER — HYDROCODONE-ACETAMINOPHEN 5-325 MG PO TABS
1.0000 | ORAL_TABLET | Freq: Four times a day (QID) | ORAL | Status: DC | PRN
  Administered 2018-10-08: 13:00:00 1 via ORAL

## 2018-10-08 MED ORDER — HYDROCODONE-ACETAMINOPHEN 5-325 MG PO TABS: 1.0000 | ORAL_TABLET | Freq: Four times a day (QID) | ORAL | 0 refills | Status: DC | PRN

## 2018-10-08 MED ORDER — CEFAZOLIN SODIUM-DEXTROSE 2-4 GM/100ML-% IV SOLN
INTRAVENOUS | Status: AC
  Filled 2018-10-08: qty 100

## 2018-10-08 MED ORDER — FENTANYL CITRATE (PF) 100 MCG/2ML IJ SOLN
INTRAMUSCULAR | Status: AC
  Filled 2018-10-08: qty 2

## 2018-10-08 MED ORDER — FAMOTIDINE 20 MG PO TABS
ORAL_TABLET | ORAL | Status: AC
  Filled 2018-10-08: qty 1

## 2018-10-08 MED ORDER — CEFAZOLIN SODIUM-DEXTROSE 2-4 GM/100ML-% IV SOLN
2.0000 g | Freq: Once | INTRAVENOUS | Status: AC
  Administered 2018-10-08: 10:00:00 2 g via INTRAVENOUS

## 2018-10-08 MED ORDER — DEXAMETHASONE SODIUM PHOSPHATE 10 MG/ML IJ SOLN
INTRAMUSCULAR | Status: DC | PRN
  Administered 2018-10-08: 5 mg via INTRAVENOUS

## 2018-10-08 MED ORDER — LIDOCAINE HCL (PF) 2 % IJ SOLN
INTRAMUSCULAR | Status: AC
  Filled 2018-10-08: qty 10

## 2018-10-08 SURGICAL SUPPLY — 44 items
BLADE SURG 15 STRL LF DISP TIS (BLADE) ×1 IMPLANT
BLADE SURG 15 STRL SS (BLADE) ×2
BNDG COHESIVE 4X5 TAN STRL (GAUZE/BANDAGES/DRESSINGS) ×3 IMPLANT
BNDG CONFORM 2 STRL LF (GAUZE/BANDAGES/DRESSINGS) ×3 IMPLANT
BNDG ELASTIC 2X5.8 VLCR STR LF (GAUZE/BANDAGES/DRESSINGS) ×3 IMPLANT
BNDG ELASTIC 3X5.8 VLCR STR LF (GAUZE/BANDAGES/DRESSINGS) ×3 IMPLANT
BNDG ESMARK 4X12 TAN STRL LF (GAUZE/BANDAGES/DRESSINGS) ×3 IMPLANT
CANISTER SUCT 1200ML W/VALVE (MISCELLANEOUS) ×3 IMPLANT
CHLORAPREP W/TINT 26 (MISCELLANEOUS) ×3 IMPLANT
CORD BIP STRL DISP 12FT (MISCELLANEOUS) ×3 IMPLANT
COVER WAND RF STERILE (DRAPES) ×3 IMPLANT
CUFF TOURN SGL QUICK 18X4 (TOURNIQUET CUFF) ×3 IMPLANT
DRAPE SPLIT 6X30 W/TAPE (DRAPES) ×3 IMPLANT
DRAPE SURG 17X11 SM STRL (DRAPES) ×3 IMPLANT
ELECT REM PT RETURN 9FT ADLT (ELECTROSURGICAL) ×3
ELECTRODE REM PT RTRN 9FT ADLT (ELECTROSURGICAL) ×1 IMPLANT
FORCEPS JEWEL BIP 4-3/4 STR (INSTRUMENTS) ×3 IMPLANT
GAUZE SPONGE 4X4 12PLY STRL (GAUZE/BANDAGES/DRESSINGS) ×3 IMPLANT
GAUZE XEROFORM 1X8 LF (GAUZE/BANDAGES/DRESSINGS) ×3 IMPLANT
GLOVE BIO SURGEON STRL SZ8 (GLOVE) ×6 IMPLANT
GLOVE INDICATOR 8.0 STRL GRN (GLOVE) ×3 IMPLANT
GOWN STRL REUS W/ TWL LRG LVL3 (GOWN DISPOSABLE) ×1 IMPLANT
GOWN STRL REUS W/ TWL XL LVL3 (GOWN DISPOSABLE) ×1 IMPLANT
GOWN STRL REUS W/TWL LRG LVL3 (GOWN DISPOSABLE) ×2
GOWN STRL REUS W/TWL XL LVL3 (GOWN DISPOSABLE) ×2
KIT CARPAL TUNNEL (MISCELLANEOUS) ×2
KIT ESCP INSRT D SLOT CANN KN (MISCELLANEOUS) ×1 IMPLANT
KIT TURNOVER KIT A (KITS) ×3 IMPLANT
NS IRRIG 1000ML POUR BTL (IV SOLUTION) ×3 IMPLANT
NS IRRIG 500ML POUR BTL (IV SOLUTION) ×3 IMPLANT
PACK EXTREMITY ARMC (MISCELLANEOUS) ×3 IMPLANT
PAD PREP 24X41 OB/GYN DISP (PERSONAL CARE ITEMS) ×3 IMPLANT
SPLINT WRIST LG LT TX990309 (SOFTGOODS) IMPLANT
SPLINT WRIST LG RT TX900304 (SOFTGOODS) ×2 IMPLANT
SPLINT WRIST M LT TX990308 (SOFTGOODS) IMPLANT
SPLINT WRIST M RT TX990303 (SOFTGOODS) IMPLANT
SPONGE GAUZE 2X2 8PLY STER LF (GAUZE/BANDAGES/DRESSINGS) ×1
SPONGE GAUZE 2X2 8PLY STRL LF (GAUZE/BANDAGES/DRESSINGS) ×2 IMPLANT
STOCKINETTE 48X4 2 PLY STRL (GAUZE/BANDAGES/DRESSINGS) ×1 IMPLANT
STOCKINETTE IMPERVIOUS 9X36 MD (GAUZE/BANDAGES/DRESSINGS) ×3 IMPLANT
STOCKINETTE STRL 4IN 9604848 (GAUZE/BANDAGES/DRESSINGS) ×3 IMPLANT
SUT PROLENE 4 0 PS 2 18 (SUTURE) ×3 IMPLANT
SUT VIC AB 3-0 SH 27 (SUTURE) ×2
SUT VIC AB 3-0 SH 27X BRD (SUTURE) ×1 IMPLANT

## 2018-10-08 NOTE — H&P (Signed)
Paper H&P to be scanned into permanent record. H&P reviewed and patient re-examined. No changes. 

## 2018-10-08 NOTE — Anesthesia Preprocedure Evaluation (Signed)
Anesthesia Evaluation  Patient identified by MRN, date of birth, ID band Patient awake    Reviewed: Allergy & Precautions, NPO status , Patient's Chart, lab work & pertinent test results  History of Anesthesia Complications (+) PONV and history of anesthetic complications  Airway Mallampati: III       Dental   Pulmonary asthma , neg sleep apnea, neg COPD, Not current smoker,           Cardiovascular hypertension, Pt. on medications (-) Past MI and (-) CHF (-) dysrhythmias (-) Valvular Problems/Murmurs     Neuro/Psych neg Seizures    GI/Hepatic Neg liver ROS, neg GERD  ,  Endo/Other  diabetes, Insulin DependentHypothyroidism   Renal/GU negative Renal ROS     Musculoskeletal   Abdominal   Peds  Hematology   Anesthesia Other Findings   Reproductive/Obstetrics                             Anesthesia Physical Anesthesia Plan  ASA: III  Anesthesia Plan: General   Post-op Pain Management:    Induction: Intravenous  PONV Risk Score and Plan: 4 or greater and Ondansetron, Dexamethasone and Midazolam  Airway Management Planned: LMA  Additional Equipment:   Intra-op Plan:   Post-operative Plan:   Informed Consent: I have reviewed the patients History and Physical, chart, labs and discussed the procedure including the risks, benefits and alternatives for the proposed anesthesia with the patient or authorized representative who has indicated his/her understanding and acceptance.       Plan Discussed with:   Anesthesia Plan Comments:         Anesthesia Quick Evaluation

## 2018-10-08 NOTE — Discharge Instructions (Addendum)
Orthopedic discharge instructions: Keep dressing dry and intact. Keep hand elevated above heart level. May shower after dressing removed on postop day 4 (Monday). Cover sutures with Band-Aids after drying off. Apply ice to affected area frequently. Take ibuprofen 600-800 mg TID with meals for 7-10 days, then as necessary. Take ES Tylenol or pain medication as prescribed when needed.  Return for follow-up in 10-14 days or as scheduled.   AMBULATORY SURGERY  DISCHARGE INSTRUCTIONS   1) The drugs that you were given will stay in your system until tomorrow so for the next 24 hours you should not:  A) Drive an automobile B) Make any legal decisions C) Drink any alcoholic beverage   2) You may resume regular meals tomorrow.  Today it is better to start with liquids and gradually work up to solid foods.  You may eat anything you prefer, but it is better to start with liquids, then soup and crackers, and gradually work up to solid foods.   3) Please notify your doctor immediately if you have any unusual bleeding, trouble breathing, redness and pain at the surgery site, drainage, fever, or pain not relieved by medication.    4) Additional Instructions:        Please contact your physician with any problems or Same Day Surgery at [phone], Monday through Friday 6 am to 4 pm, or [place] [facility] [place] [facility] number at [phone]. 

## 2018-10-08 NOTE — Anesthesia Postprocedure Evaluation (Signed)
Anesthesia Post Note  Patient: Jennifer Lozano  Procedure(s) Performed: CARPAL TUNNEL RELEASE ENDOSCOPIC (Right Wrist) DE QUERVAIN'S TENOSYNOVITIS RELEASE (Right Wrist)  Patient location during evaluation: PACU Anesthesia Type: General Level of consciousness: awake and alert Pain management: pain level controlled Vital Signs Assessment: post-procedure vital signs reviewed and stable Respiratory status: spontaneous breathing and respiratory function stable Cardiovascular status: stable Anesthetic complications: no     Last Vitals:  Vitals:   10/08/18 1116 10/08/18 1120  BP: (!) 159/86   Pulse: 78 80  Resp: 19 (!) 21  Temp: (!) 36 C   SpO2: 100% 100%    Last Pain:  Vitals:   10/08/18 1116  TempSrc:   PainSc: 0-No pain                 Bawi Lakins K

## 2018-10-08 NOTE — Transfer of Care (Signed)
Immediate Anesthesia Transfer of Care Note  Patient: Jennifer Lozano  Procedure(s) Performed: CARPAL TUNNEL RELEASE ENDOSCOPIC (Right Wrist) DE QUERVAIN'S TENOSYNOVITIS RELEASE (Right Wrist)  Patient Location: PACU  Anesthesia Type:General  Level of Consciousness: awake, oriented and drowsy  Airway & Oxygen Therapy: Patient Spontanous Breathing and Patient connected to face mask oxygen  Post-op Assessment: Report given to RN and Post -op Vital signs reviewed and stable  Post vital signs: Reviewed and stable  Last Vitals:  Vitals Value Taken Time  BP 159/86 10/08/18 1116  Temp    Pulse 80 10/08/18 1119  Resp 21 10/08/18 1119  SpO2 100 % 10/08/18 1119  Vitals shown include unvalidated device data.  Last Pain:  Vitals:   10/08/18 0747  TempSrc: Tympanic  PainSc: 0-No pain         Complications: No apparent anesthesia complications

## 2018-10-08 NOTE — Op Note (Signed)
10/08/2018  11:09 AM  Patient:   Jennifer Lozano  Pre-Op Diagnosis:   1.  Right carpal tunnel syndrome.  2.  DeQuervain's tenosynovitis, right wrist.  Post-Op Diagnosis:   Same.  Procedure:   1.  Endoscopic right carpal tunnel release.  2.  Release of first dorsal compartment, right wrist.  Surgeon:   Maryagnes Amos, MD  Anesthesia:   General LMA  Findings:   As above.  Complications:   None  EBL:   0 cc  Fluids:   550 cc crystalloid  TT:   22 minutes at 250 mmHg  Drains:   None  Closure:   4-0 Prolene interrupted sutures  Brief Clinical Note:   The patient is a 53 year old female with a long history of pain and paresthesias to her right hand. Her symptoms have persisted despite medications, activity modification, steroid injections, splinting, etc. Her history and examination are consistent with carpal tunnel syndrome. The patient presents at this time for an endoscopic right carpal tunnel release.  The patient also notes a history of pain along the radial aspect of her right wrist. The symptoms have persisted despite medications, activity modification, etc. Her symptoms are consistent with de Quervain's tenosynovitis of the right wrist. She presents at this time for release of the first dorsal compartment as well.  Procedure:   The patient was brought into the operating room and lain in the supine position. After adequate general laryngeal mask anesthesia was obtained, the right hand and upper extremity were prepped with ChloraPrep solution before being draped sterilely. Preoperative antibiotics were administered. A timeout was performed to verify the appropriate surgical site before the limb was exsanguinated with an Esmarch and the tourniquet inflated to 250 mmHg.   An approximately 1.5-2 cm incision was made over the volar wrist flexion crease, centered over the palmaris longus tendon. The incision was carried down through the subcutaneous tissues with care taken to identify  and protect any neurovascular structures. The distal forearm fascia was penetrated just proximal to the transverse carpal ligament. The soft tissues were released off the superficial and deep surfaces of the distal forearm fascia and this was released proximally for 3-4 cm under direct visualization.  Attention was directed distally. The Therapist, nutritional was passed beneath the transverse carpal ligament along the ulnar aspect of the carpal tunnel and used to release any adhesions as well as to remove any adherent synovial tissue before first the smaller then the larger of the two dilators were passed beneath the transverse carpal ligament along the ulnar margin of the carpal tunnel. The slotted cannula was introduced and the endoscope was placed into the slotted cannula and the undersurface of the transverse carpal ligament visualized. The distal margin of the transverse carpal ligament was marked by placing a 25-gauge needle percutaneously at Kaplan's cardinal point so that it entered the distal portion of the slotted cannula. Under endoscopic visualization, the transverse carpal ligament was released from proximal to distal using the end-cutting blade. A second pass was performed to ensure complete release of the ligament. The adequacy of release was verified both endoscopically and by palpation using the freer elevator.  Next, the radial aspect of the wrist was addressed. A 1.5-2 cm incision was made transversely over the first dorsal compartment. The incision was carried down through subcutaneous tissues with care taken to identify and protect the sensory nerves and veins running in this area. The mass was identified and circumferentially dissected out before being removed. The underlying retinaculum was  identified. The first dorsal compartment was released from proximal to distal using Metzenbaum scissors. The underlying tendons were carefully inspected and found to be intact. No additional adhesions were  identified.  The wounds were irrigated thoroughly with sterile saline solution before being closed using 4-0 Prolene interrupted sutures. A total of 10 cc of 0.5% plain Sensorcaine was injected in and around the incisions before a sterile bulky dressing was applied to the wounds. The patient was placed into a volar wrist splint before being awakened, extubated, and returned to the recovery room in satisfactory condition after tolerating the procedure well.

## 2018-10-08 NOTE — Anesthesia Post-op Follow-up Note (Signed)
Anesthesia QCDR form completed.        

## 2018-10-08 NOTE — Anesthesia Procedure Notes (Signed)
Procedure Name: LMA Insertion Date/Time: 10/08/2018 10:26 AM Performed by: Babs Sciara, CRNA Pre-anesthesia Checklist: Patient identified, Emergency Drugs available, Suction available, Patient being monitored and Timeout performed Patient Re-evaluated:Patient Re-evaluated prior to induction Oxygen Delivery Method: Circle system utilized Preoxygenation: Pre-oxygenation with 100% oxygen Induction Type: IV induction Ventilation: Mask ventilation without difficulty LMA: LMA inserted LMA Size: 4.0 Number of attempts: 1 Placement Confirmation: positive ETCO2 and breath sounds checked- equal and bilateral Tube secured with: Tape Dental Injury: Teeth and Oropharynx as per pre-operative assessment

## 2018-10-21 ENCOUNTER — Other Ambulatory Visit: Payer: Self-pay | Admitting: Surgery

## 2018-10-23 ENCOUNTER — Other Ambulatory Visit: Payer: Self-pay | Admitting: Surgery

## 2018-10-29 ENCOUNTER — Other Ambulatory Visit: Payer: Self-pay

## 2018-10-29 ENCOUNTER — Encounter
Admission: RE | Admit: 2018-10-29 | Discharge: 2018-10-29 | Disposition: A | Discharge status: Home / Self Care | Payer: BLUE CROSS/BLUE SHIELD | Source: Ambulatory Visit | Attending: Surgery | Admitting: Surgery

## 2018-10-29 HISTORY — DX: Personal history of other diseases of the digestive system: Z87.19

## 2018-10-29 NOTE — Patient Instructions (Signed)
Your procedure is scheduled on: November 05, 2018 [facility] Report to Day Surgery on the 2nd floor of the [facility]. To find out your arrival time, please call [phone] between 1PM - 3PM on: November 04, 2018 TUESDAY  REMEMBER: Instructions that are not followed completely may result in serious medical risk, up to and including death; or upon the discretion of your surgeon and anesthesiologist your surgery may need to be rescheduled.  Do not eat food after midnight the night before surgery.  No gum chewing, lozengers or hard candies.  You may however, drink CLEAR liquids up to  4:30 AM MORNING OF SURGERY. Clear liquids include: - water   Do NOT drink anything that is not on this list.  Type 1 and Type 2 diabetics should only drink water.  DRINK G2 GATORADE DRINK AT 4:30 AM THEN NOTHING TO DRINK BY MOUTH  No Alcohol for 24 hours before or after surgery.  No Smoking including e-cigarettes for 24 hours prior to surgery.  No chewable tobacco products for at least 6 hours prior to surgery.  No nicotine patches on the day of surgery.  On the morning of surgery brush your teeth with toothpaste and water, you may rinse your mouth with mouthwash if you wish. Do not swallow any toothpaste or mouthwash.  Notify your doctor if there is any change in your medical condition (cold, fever, infection).  Do not wear jewelry, make-up, hairpins, clips or nail polish.  Do not wear lotions, powders, or perfumes.   Do not shave 48 hours prior to surgery.   Contacts and dentures may not be worn into surgery.  Do not bring valuables to the hospital, including drivers license, insurance or credit cards.  [facility] is not responsible for any belongings or valuables.   TAKE THESE MEDICATIONS THE MORNING OF SURGERY: LEVOTHYROXINE  DO NOT TAKE LISINOPRIL- HYDROCHLOROTHIAZIDE DAY OF SURGERY.  Use CHG [place] directed on instruction sheet.  LEAVE INSULIN AND SENSOR ON.  Follow  recommendations from Cardiologist, Pulmonologist or PCP regarding stopping Aspirin, Coumadin, Plavix, Eliquis, Pradaxa, or Pletal.  Stop Anti-inflammatories (NSAIDS) such as Advil, Aleve, Ibuprofen, Motrin, Naproxen, Naprosyn and Aspirin based products such as Excedrin, Goodys Powder, BC Powder. (May take Tylenol or Acetaminophen if needed.)  Stop ANY OVER THE COUNTER supplements until after surgery. (May continue Vitamin D, Vitamin B, and multivitamin.)  Wear comfortable clothing (specific to your surgery type) to the hospital.  Plan for stool softeners for home use.  If you are being admitted to the hospital overnight, leave your suitcase in the car. After surgery it may be brought to your room.  If you are being discharged the day of surgery, you will not be allowed to drive home. You will need a responsible adult to drive you home and stay with you that night.   If you are taking public transportation, you will need to have a responsible adult with you. Please confirm with your physician that it is acceptable to use public transportation.   Please call [phone] if you have any questions about these instructions.

## 2018-10-31 ENCOUNTER — Other Ambulatory Visit
Admission: RE | Admit: 2018-10-31 | Discharge: 2018-10-31 | Disposition: A | Discharge status: Home / Self Care | Payer: BLUE CROSS/BLUE SHIELD | Source: Ambulatory Visit | Attending: Surgery | Admitting: Surgery

## 2018-10-31 ENCOUNTER — Other Ambulatory Visit: Payer: Self-pay

## 2018-10-31 DIAGNOSIS — Z20828 Contact with and (suspected) exposure to other viral communicable diseases: Secondary | ICD-10-CM | POA: Insufficient documentation

## 2018-10-31 DIAGNOSIS — Z01812 Encounter for preprocedural laboratory examination: Secondary | ICD-10-CM | POA: Insufficient documentation

## 2018-10-31 LAB — SARS CORONAVIRUS 2 (TAT 6-24 HRS): SARS Coronavirus 2: NEGATIVE

## 2018-11-05 ENCOUNTER — Encounter
Admission: RE | Disposition: A | Discharge status: Home / Self Care | Payer: Self-pay | Source: Home / Self Care | Attending: Surgery

## 2018-11-05 ENCOUNTER — Ambulatory Visit: Payer: BLUE CROSS/BLUE SHIELD | Admitting: Anesthesiology

## 2018-11-05 ENCOUNTER — Other Ambulatory Visit: Payer: Self-pay

## 2018-11-05 ENCOUNTER — Ambulatory Visit
Admission: RE | Admit: 2018-11-05 | Discharge: 2018-11-05 | Disposition: A | Discharge status: Home / Self Care | Payer: BLUE CROSS/BLUE SHIELD | Attending: Surgery | Admitting: Surgery

## 2018-11-05 DIAGNOSIS — Z79899 Other long term (current) drug therapy: Secondary | ICD-10-CM | POA: Insufficient documentation

## 2018-11-05 DIAGNOSIS — Z79890 Hormone replacement therapy (postmenopausal): Secondary | ICD-10-CM | POA: Insufficient documentation

## 2018-11-05 DIAGNOSIS — F17200 Nicotine dependence, unspecified, uncomplicated: Secondary | ICD-10-CM | POA: Insufficient documentation

## 2018-11-05 DIAGNOSIS — E669 Obesity, unspecified: Secondary | ICD-10-CM | POA: Insufficient documentation

## 2018-11-05 DIAGNOSIS — Z6841 Body Mass Index (BMI) 40.0 and over, adult: Secondary | ICD-10-CM | POA: Insufficient documentation

## 2018-11-05 DIAGNOSIS — J45909 Unspecified asthma, uncomplicated: Secondary | ICD-10-CM | POA: Insufficient documentation

## 2018-11-05 DIAGNOSIS — I10 Essential (primary) hypertension: Secondary | ICD-10-CM | POA: Insufficient documentation

## 2018-11-05 DIAGNOSIS — G5602 Carpal tunnel syndrome, left upper limb: Secondary | ICD-10-CM | POA: Diagnosis present

## 2018-11-05 HISTORY — PX: CARPAL TUNNEL RELEASE: SHX101

## 2018-11-05 LAB — GLUCOSE, CAPILLARY
Glucose-Capillary: 147 mg/dL — ABNORMAL HIGH (ref 70–99)
Glucose-Capillary: 130 mg/dL — ABNORMAL HIGH (ref 70–99)

## 2018-11-05 SURGERY — RELEASE, CARPAL TUNNEL, ENDOSCOPIC
Anesthesia: General | Site: Wrist | Laterality: Left

## 2018-11-05 MED ORDER — FENTANYL CITRATE (PF) 100 MCG/2ML IJ SOLN
25.0000 ug | INTRAMUSCULAR | Status: DC | PRN
  Administered 2018-11-05: 09:00:00 25 ug via INTRAVENOUS

## 2018-11-05 MED ORDER — PROPOFOL 10 MG/ML IV BOLUS
INTRAVENOUS | Status: DC | PRN
  Administered 2018-11-05: 160 mg via INTRAVENOUS

## 2018-11-05 MED ORDER — PROPOFOL 10 MG/ML IV BOLUS
INTRAVENOUS | Status: AC
  Filled 2018-11-05: qty 20

## 2018-11-05 MED ORDER — ROCURONIUM BROMIDE 100 MG/10ML IV SOLN
INTRAVENOUS | Status: DC | PRN
  Administered 2018-11-05: 5 mg via INTRAVENOUS
  Administered 2018-11-05: 20 mg via INTRAVENOUS

## 2018-11-05 MED ORDER — DEXAMETHASONE SODIUM PHOSPHATE 10 MG/ML IJ SOLN
INTRAMUSCULAR | Status: DC | PRN
  Administered 2018-11-05: 8 mg via INTRAVENOUS

## 2018-11-05 MED ORDER — FAMOTIDINE 20 MG PO TABS
20.0000 mg | ORAL_TABLET | Freq: Once | ORAL | Status: AC
  Administered 2018-11-05: 06:00:00 20 mg via ORAL

## 2018-11-05 MED ORDER — MIDAZOLAM HCL 2 MG/2ML IJ SOLN
INTRAMUSCULAR | Status: DC | PRN
  Administered 2018-11-05 (×2): 1 mg via INTRAVENOUS

## 2018-11-05 MED ORDER — SUGAMMADEX SODIUM 200 MG/2ML IV SOLN
INTRAVENOUS | Status: DC | PRN
  Administered 2018-11-05: 222.2 mg via INTRAVENOUS

## 2018-11-05 MED ORDER — BUPIVACAINE HCL (PF) 0.5 % IJ SOLN
INTRAMUSCULAR | Status: DC | PRN
  Administered 2018-11-05: 10 mL

## 2018-11-05 MED ORDER — CEFAZOLIN SODIUM-DEXTROSE 2-4 GM/100ML-% IV SOLN
2.0000 g | INTRAVENOUS | Status: AC
  Administered 2018-11-05: 08:00:00 2 g via INTRAVENOUS

## 2018-11-05 MED ORDER — HYDROCODONE-ACETAMINOPHEN 5-325 MG PO TABS: 1.0000 | ORAL_TABLET | Freq: Four times a day (QID) | ORAL | 0 refills | Status: DC | PRN

## 2018-11-05 MED ORDER — FAMOTIDINE 20 MG PO TABS
ORAL_TABLET | ORAL | Status: AC
  Administered 2018-11-05: 20 mg via ORAL
  Filled 2018-11-05: qty 1

## 2018-11-05 MED ORDER — PROMETHAZINE HCL 25 MG/ML IJ SOLN: 6.2500 mg | INTRAMUSCULAR | Status: DC | PRN

## 2018-11-05 MED ORDER — ONDANSETRON HCL 4 MG/2ML IJ SOLN
INTRAMUSCULAR | Status: DC | PRN
  Administered 2018-11-05: 4 mg via INTRAVENOUS

## 2018-11-05 MED ORDER — CHLORHEXIDINE GLUCONATE 4 % EX LIQD: 60.0000 mL | Freq: Once | CUTANEOUS | Status: DC

## 2018-11-05 MED ORDER — FENTANYL CITRATE (PF) 100 MCG/2ML IJ SOLN
INTRAMUSCULAR | Status: AC
  Filled 2018-11-05: qty 2

## 2018-11-05 MED ORDER — BUPIVACAINE HCL (PF) 0.5 % IJ SOLN
INTRAMUSCULAR | Status: AC
  Filled 2018-11-05: qty 30

## 2018-11-05 MED ORDER — SODIUM CHLORIDE 0.9 % IV SOLN
INTRAVENOUS | Status: DC
  Administered 2018-11-05: 06:00:00 via INTRAVENOUS

## 2018-11-05 MED ORDER — LACTATED RINGERS IV SOLN
INTRAVENOUS | Status: DC | PRN
  Administered 2018-11-05: 07:00:00 via INTRAVENOUS

## 2018-11-05 MED ORDER — LIDOCAINE HCL (PF) 2 % IJ SOLN
INTRAMUSCULAR | Status: AC
  Filled 2018-11-05: qty 10

## 2018-11-05 MED ORDER — MIDAZOLAM HCL 2 MG/2ML IJ SOLN
INTRAMUSCULAR | Status: AC
  Filled 2018-11-05: qty 2

## 2018-11-05 MED ORDER — CEFAZOLIN SODIUM-DEXTROSE 2-4 GM/100ML-% IV SOLN
INTRAVENOUS | Status: AC
  Filled 2018-11-05: qty 100

## 2018-11-05 MED ORDER — FENTANYL CITRATE (PF) 100 MCG/2ML IJ SOLN
INTRAMUSCULAR | Status: DC | PRN
  Administered 2018-11-05: 25 ug via INTRAVENOUS
  Administered 2018-11-05: 50 ug via INTRAVENOUS

## 2018-11-05 SURGICAL SUPPLY — 29 items
BNDG COHESIVE 4X5 TAN STRL (GAUZE/BANDAGES/DRESSINGS) ×3 IMPLANT
BNDG ELASTIC 2X5.8 VLCR STR LF (GAUZE/BANDAGES/DRESSINGS) ×3 IMPLANT
BNDG ESMARK 4X12 TAN STRL LF (GAUZE/BANDAGES/DRESSINGS) ×3 IMPLANT
CANISTER SUCT 1200ML W/VALVE (MISCELLANEOUS) ×3 IMPLANT
CHLORAPREP W/TINT 26 (MISCELLANEOUS) ×3 IMPLANT
CORD BIP STRL DISP 12FT (MISCELLANEOUS) ×3 IMPLANT
COVER WAND RF STERILE (DRAPES) IMPLANT
CUFF TOURN SGL QUICK 18X4 (TOURNIQUET CUFF) ×3 IMPLANT
DRAPE SURG 17X11 SM STRL (DRAPES) ×3 IMPLANT
FORCEPS JEWEL BIP 4-3/4 STR (INSTRUMENTS) ×3 IMPLANT
GAUZE SPONGE 4X4 12PLY STRL (GAUZE/BANDAGES/DRESSINGS) ×3 IMPLANT
GAUZE XEROFORM 1X8 LF (GAUZE/BANDAGES/DRESSINGS) ×3 IMPLANT
GLOVE BIO SURGEON STRL SZ8 (GLOVE) ×3 IMPLANT
GLOVE INDICATOR 8.0 STRL GRN (GLOVE) ×3 IMPLANT
GOWN STRL REUS W/ TWL LRG LVL3 (GOWN DISPOSABLE) ×1 IMPLANT
GOWN STRL REUS W/ TWL XL LVL3 (GOWN DISPOSABLE) ×1 IMPLANT
GOWN STRL REUS W/TWL LRG LVL3 (GOWN DISPOSABLE) ×2
GOWN STRL REUS W/TWL XL LVL3 (GOWN DISPOSABLE) ×2
KIT CARPAL TUNNEL (MISCELLANEOUS) ×2
KIT ESCP INSRT D SLOT CANN KN (MISCELLANEOUS) ×1 IMPLANT
KIT TURNOVER KIT A (KITS) ×3 IMPLANT
NS IRRIG 500ML POUR BTL (IV SOLUTION) ×3 IMPLANT
PACK EXTREMITY ARMC (MISCELLANEOUS) ×3 IMPLANT
SPLINT WRIST LG LT TX990309 (SOFTGOODS) ×3 IMPLANT
SPLINT WRIST LG RT TX900304 (SOFTGOODS) IMPLANT
SPLINT WRIST M LT TX990308 (SOFTGOODS) IMPLANT
SPLINT WRIST M RT TX990303 (SOFTGOODS) IMPLANT
STOCKINETTE IMPERVIOUS 9X36 MD (GAUZE/BANDAGES/DRESSINGS) ×3 IMPLANT
SUT PROLENE 4 0 PS 2 18 (SUTURE) ×3 IMPLANT

## 2018-11-05 NOTE — Discharge Instructions (Addendum)
Orthopedic discharge instructions: Keep dressing dry and intact. Keep hand elevated above heart level. May shower after dressing removed on postop day 4 (Sunday). Cover sutures with Band-Aids after drying off. Apply ice to affected area frequently. Take ibuprofen 600-800 mg TID with meals for 7-10 days, then as necessary. Take ES Tylenol or pain medication as prescribed when needed.  Return for follow-up in 10-14 days or as scheduled.  AMBULATORY SURGERY  DISCHARGE INSTRUCTIONS   1) The drugs that you were given will stay in your system until tomorrow so for the next 24 hours you should not:  A) Drive an automobile B) Make any legal decisions C) Drink any alcoholic beverage   2) You may resume regular meals tomorrow.  Today it is better to start with liquids and gradually work up to solid foods.  You may eat anything you prefer, but it is better to start with liquids, then soup and crackers, and gradually work up to solid foods.   3) Please notify your doctor immediately if you have any unusual bleeding, trouble breathing, redness and pain at the surgery site, drainage, fever, or pain not relieved by medication.    4) Additional Instructions:  Please contact your physician with any problems or Same Day Surgery at [phone], Monday through Friday 6 am to 4 pm, or [facility] Main number at [phone]. 

## 2018-11-05 NOTE — Anesthesia Procedure Notes (Signed)
Procedure Name: Intubation Date/Time: 11/05/2018 8:01 AM Performed by: Allean Found, CRNA Pre-anesthesia Checklist: Patient identified, Patient being monitored, Timeout performed, Emergency Drugs available and Suction available Patient Re-evaluated:Patient Re-evaluated prior to induction Oxygen Delivery Method: Circle system utilized Preoxygenation: Pre-oxygenation with 100% oxygen Induction Type: IV induction Ventilation: Mask ventilation without difficulty Laryngoscope Size: 3 and McGraph Grade View: Grade I Tube type: Oral Tube size: 7.0 mm Number of attempts: 1 Airway Equipment and Method: Stylet Placement Confirmation: ETT inserted through vocal cords under direct vision,  positive ETCO2 and breath sounds checked- equal and bilateral Secured at: 21 cm Tube secured with: Tape Dental Injury: Teeth and Oropharynx as per pre-operative assessment

## 2018-11-05 NOTE — Transfer of Care (Signed)
Immediate Anesthesia Transfer of Care Note  Patient: Jennifer Lozano  Procedure(s) Performed: CARPAL TUNNEL RELEASE ENDOSCOPIC (Left Wrist)  Patient Location: PACU  Anesthesia Type:General  Level of Consciousness: awake and alert   Airway & Oxygen Therapy: Patient Spontanous Breathing and Patient connected to face mask oxygen  Post-op Assessment: Report given to RN and Post -op Vital signs reviewed and stable  Post vital signs: Reviewed and stable  Last Vitals:  Vitals Value Taken Time  BP 153/65 11/05/18 0831  Temp    Pulse 80 11/05/18 0834  Resp 19 11/05/18 0834  SpO2 100 % 11/05/18 0834  Vitals shown include unvalidated device data.  Last Pain:  Vitals:   11/05/18 0604  TempSrc: Temporal  PainSc: 0-No pain         Complications: No apparent anesthesia complications

## 2018-11-05 NOTE — OR Nursing (Signed)
Patient has insulin pump in place at a basal rate of 2 units/hr.  Anesthesia aware.

## 2018-11-05 NOTE — Anesthesia Preprocedure Evaluation (Signed)
Anesthesia Evaluation  Patient identified by MRN, date of birth, ID band Patient awake    Reviewed: Allergy & Precautions, NPO status , Patient's Chart, lab work & pertinent test results  History of Anesthesia Complications (+) PONV and history of anesthetic complications  Airway Mallampati: III       Dental  (+) Dental Advidsory Given   Pulmonary asthma , neg sleep apnea, neg COPD, Not current smoker,           Cardiovascular hypertension, Pt. on medications (-) Past MI and (-) CHF (-) dysrhythmias (-) Valvular Problems/Murmurs     Neuro/Psych neg Seizures    GI/Hepatic Neg liver ROS, hiatal hernia, neg GERD  ,  Endo/Other  diabetes, Insulin DependentHypothyroidism Morbid obesity  Renal/GU negative Renal ROS     Musculoskeletal   Abdominal   Peds  Hematology   Anesthesia Other Findings Past Medical History: No date: Asthma No date: Diabetes mellitus without complication ([place]) No date: History of hiatal hernia No date: Hypertension No date: PONV (postoperative nausea and vomiting)   Reproductive/Obstetrics                             Anesthesia Physical  Anesthesia Plan  ASA: III  Anesthesia Plan: General   Post-op Pain Management:    Induction: Intravenous  PONV Risk Score and Plan: 4 or greater and Ondansetron, Dexamethasone, Midazolam and Treatment may vary due to age or medical condition  Airway Management Planned: Oral ETT  Additional Equipment:   Intra-op Plan:   Post-operative Plan: Extubation in OR  Informed Consent: I have reviewed the patients History and Physical, chart, labs and discussed the procedure including the risks, benefits and alternatives for the proposed anesthesia with the patient or authorized representative who has indicated his/her understanding and acceptance.       Plan Discussed with:   Anesthesia Plan Comments:          Anesthesia Quick Evaluation

## 2018-11-05 NOTE — Anesthesia Postprocedure Evaluation (Signed)
Anesthesia Post Note  Patient: Jennifer Lozano  Procedure(s) Performed: CARPAL TUNNEL RELEASE ENDOSCOPIC (Left Wrist)  Patient location during evaluation: PACU Anesthesia Type: General Level of consciousness: awake and alert Pain management: pain level controlled Vital Signs Assessment: post-procedure vital signs reviewed and stable Respiratory status: spontaneous breathing, nonlabored ventilation, respiratory function stable and patient connected to nasal cannula oxygen Cardiovascular status: blood pressure returned to baseline and stable Postop Assessment: no apparent nausea or vomiting Anesthetic complications: no     Last Vitals:  Vitals:   11/05/18 0920 11/05/18 0933  BP: (!) 160/57 (!) 154/72  Pulse: 79 77  Resp: 19 16  Temp: (!) 36.2 C (!) 36.3 C  SpO2: 92%     Last Pain:  Vitals:   11/05/18 0933  TempSrc: Temporal  PainSc: 3                  Martha Clan

## 2018-11-05 NOTE — Anesthesia Post-op Follow-up Note (Signed)
Anesthesia QCDR form completed.        

## 2018-11-05 NOTE — H&P (Signed)
Paper H&P to be scanned into permanent record. H&P reviewed and patient re-examined. No changes. 

## 2018-11-05 NOTE — Op Note (Signed)
11/05/2018  8:31 AM  Patient:   Jennifer Lozano  Pre-Op Diagnosis:   Left carpal tunnel syndrome.  Post-Op Diagnosis:   Same.  Procedure:   Endoscopic left carpal tunnel release.  Surgeon:   Pascal Lux, MD  Anesthesia:   GET  Findings:   As above.  Complications:   None  EBL:   0 cc  Fluids:   600 cc crystalloid  TT:   13 minutes at 250 mmHg  Drains:   None  Closure:   4-0 Prolene interrupted sutures  Brief Clinical Note:   The patient is a 53 year old female with a history of progressively worsening pain and paresthesias to her left hand. Her symptoms have persisted despite medications, activity modification, steroid injections, bracing, etc. Her history and examination are consistent with carpal tunnel syndrome. The patient has done well following her recent endoscopic right carpal tunnel release. The patient presents at this time for an endoscopic left carpal tunnel release.   Procedure:   The patient was brought into the operating room and lain in the supine position. After adequate general laryngeal mask anesthesia was obtained, the left hand and upper extremity were prepped with ChloraPrep solution before being draped sterilely. Preoperative antibiotics were administered. A timeout was performed to verify the appropriate surgical site before the limb was exsanguinated with an Esmarch and the tourniquet inflated to 250 mmHg. An approximately 1.5-2 cm incision was made over the volar wrist flexion crease, centered over the palmaris longus tendon. The incision was carried down through the subcutaneous tissues with care taken to identify and protect any neurovascular structures. The distal forearm fascia was penetrated just proximal to the transverse carpal ligament. The soft tissues were released off the superficial and deep surfaces of the distal forearm fascia and this was released proximally for 3-4 cm under direct visualization.  Attention was directed distally. The  Soil scientist was passed beneath the transverse carpal ligament along the ulnar aspect of the carpal tunnel and used to release any adhesions as well as to remove any adherent synovial tissue before first the smaller then the larger of the two dilators were passed beneath the transverse carpal ligament along the ulnar margin of the carpal tunnel. The slotted cannula was introduced and the endoscope was placed into the slotted cannula and the undersurface of the transverse carpal ligament visualized. The distal margin of the transverse carpal ligament was marked by placing a 25-gauge needle percutaneously at [place] cardinal point so that it entered the distal portion of the slotted cannula. Under endoscopic visualization, the transverse carpal ligament was released from proximal to distal using the end-cutting blade. A second pass was performed to ensure complete release of the ligament. The adequacy of release was verified both endoscopically and by palpation using the freer elevator.  The wound was irrigated thoroughly with sterile saline solution before being closed using 4-0 Prolene interrupted sutures. A total of 10 cc of 0.5% plain Sensorcaine was injected in and around the incision before a sterile bulky dressing was applied to the wound. The patient was placed into a volar wrist splint before being awakened and returned to the recovery room in satisfactory condition after tolerating the procedure well.

## 2019-03-09 ENCOUNTER — Encounter: Payer: Self-pay | Admitting: *Deleted

## 2019-03-09 ENCOUNTER — Emergency Department: Payer: BLUE CROSS/BLUE SHIELD

## 2019-03-09 ENCOUNTER — Other Ambulatory Visit: Payer: Self-pay

## 2019-03-09 ENCOUNTER — Emergency Department
Admission: EM | Admit: 2019-03-09 | Discharge: 2019-03-09 | Disposition: A | Discharge status: Home / Self Care | Payer: BLUE CROSS/BLUE SHIELD | Attending: Emergency Medicine | Admitting: Emergency Medicine

## 2019-03-09 DIAGNOSIS — R55 Syncope and collapse: Secondary | ICD-10-CM | POA: Diagnosis not present

## 2019-03-09 DIAGNOSIS — E119 Type 2 diabetes mellitus without complications: Secondary | ICD-10-CM | POA: Diagnosis not present

## 2019-03-09 DIAGNOSIS — R110 Nausea: Secondary | ICD-10-CM | POA: Diagnosis not present

## 2019-03-09 DIAGNOSIS — G43909 Migraine, unspecified, not intractable, without status migrainosus: Secondary | ICD-10-CM | POA: Insufficient documentation

## 2019-03-09 DIAGNOSIS — G43009 Migraine without aura, not intractable, without status migrainosus: Secondary | ICD-10-CM

## 2019-03-09 DIAGNOSIS — I10 Essential (primary) hypertension: Secondary | ICD-10-CM | POA: Insufficient documentation

## 2019-03-09 DIAGNOSIS — Z7984 Long term (current) use of oral hypoglycemic drugs: Secondary | ICD-10-CM | POA: Diagnosis not present

## 2019-03-09 LAB — BASIC METABOLIC PANEL
Anion gap: 11 (ref 5–15)
BUN: 25 mg/dL — ABNORMAL HIGH (ref 6–20)
CO2: 26 mmol/L (ref 22–32)
Calcium: 9.3 mg/dL (ref 8.9–10.3)
Chloride: 104 mmol/L (ref 98–111)
Creatinine, Ser: 1.36 mg/dL — ABNORMAL HIGH (ref 0.44–1.00)
GFR calc Af Amer: 51 mL/min — ABNORMAL LOW (ref >60)
GFR calc non Af Amer: 44 mL/min — ABNORMAL LOW (ref >60)
Glucose, Bld: 212 mg/dL — ABNORMAL HIGH (ref 70–99)
Potassium: 4.2 mmol/L (ref 3.5–5.1)
Sodium: 141 mmol/L (ref 135–145)

## 2019-03-09 LAB — TROPONIN I (HIGH SENSITIVITY)
Troponin I (High Sensitivity): 4 ng/L (ref <18)
Troponin I (High Sensitivity): 5 ng/L (ref <18)

## 2019-03-09 LAB — CBC
HCT: 42.9 % (ref 36.0–46.0)
Hemoglobin: 14.3 g/dL (ref 12.0–15.0)
MCH: 30.4 pg (ref 26.0–34.0)
MCHC: 33.3 g/dL (ref 30.0–36.0)
MCV: 91.3 fL (ref 80.0–100.0)
Platelets: 266 10*3/uL (ref 150–400)
RBC: 4.7 MIL/uL (ref 3.87–5.11)
RDW: 12.8 % (ref 11.5–15.5)
WBC: 10.1 10*3/uL (ref 4.0–10.5)
nRBC: 0 % (ref 0.0–0.2)

## 2019-03-09 MED ORDER — SODIUM CHLORIDE 0.9% FLUSH
3.0000 mL | Freq: Once | INTRAVENOUS | Status: AC
  Administered 2019-03-09: 19:00:00 3 mL via INTRAVENOUS

## 2019-03-09 MED ORDER — SODIUM CHLORIDE 0.9 % IV BOLUS
1000.0000 mL | Freq: Once | INTRAVENOUS | Status: AC
  Administered 2019-03-09: 19:00:00 1000 mL via INTRAVENOUS

## 2019-03-09 MED ORDER — DEXAMETHASONE SODIUM PHOSPHATE 10 MG/ML IJ SOLN
10.0000 mg | Freq: Once | INTRAMUSCULAR | Status: AC
  Administered 2019-03-09: 20:00:00 10 mg via INTRAVENOUS
  Filled 2019-03-09: qty 1

## 2019-03-09 MED ORDER — BUTALBITAL-APAP-CAFFEINE 50-325-40 MG PO TABS: 1.0000 | ORAL_TABLET | Freq: Four times a day (QID) | ORAL | 0 refills | Status: AC | PRN

## 2019-03-09 MED ORDER — AZITHROMYCIN 250 MG PO TABS: ORAL_TABLET | ORAL | 0 refills | Status: DC

## 2019-03-09 MED ORDER — DIPHENHYDRAMINE HCL 50 MG/ML IJ SOLN
25.0000 mg | Freq: Once | INTRAMUSCULAR | Status: AC
  Administered 2019-03-09: 19:00:00 25 mg via INTRAVENOUS
  Filled 2019-03-09: qty 1

## 2019-03-09 MED ORDER — PROCHLORPERAZINE EDISYLATE 10 MG/2ML IJ SOLN
10.0000 mg | Freq: Once | INTRAMUSCULAR | Status: AC
  Administered 2019-03-09: 10 mg via INTRAVENOUS
  Filled 2019-03-09: qty 2

## 2019-03-09 NOTE — ED Notes (Signed)
E-signature pad malfunction but pt verbally expressed an understanding of the discharge instructions/ medications and follow up provided to her and had no further questions for this RN. This serves as the pt's signature for accepting discharge.

## 2019-03-09 NOTE — ED Notes (Signed)
First Nurse Note:  Pt brought over via [facility] Walk In due to syncopal episode while at work today.  Pt A/Ox4, NAD noted upon arrival.

## 2019-03-09 NOTE — ED Triage Notes (Addendum)
Pt sent [facility] for eval of syncope.  Pt was at work today at [employer] and passed out.  Pt has a headache   Pt reports elevated blood pressure.  Pt took bp meds this am. Pt denies sob or chest pain.  Nonsmoker.  Pt has a cough.  Pt alert speech clear.

## 2019-03-09 NOTE — ED Notes (Signed)
Pt detatched from the monitor in order to ambulate to the restroom. Pt able to ambulate w/o assistance. Pt st " The Dr said I could go home, I feel better". 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   Nothing more needed from staff at this time. Pt A&Ox4, skin warm and dry and pt not exhibiting any acute distress.

## 2019-03-09 NOTE — ED Provider Notes (Signed)
[facility] Emergency Department Provider Note  ____________________________________________   First MD Initiated Contact with Patient 03/09/19 1707     (approximate)  I have reviewed the triage vital signs and the nursing notes.   HISTORY  Chief Complaint Loss of Consciousness    HPI Jennifer Lozano is a 54 y.o. female with past medical history as below here with multiple complaints.   Patient states that she was at work today.  She forgot her blood pressure medications.  While at work, she began to develop a progressively worsening aching, pressure-like headache along with sensation of lightheadedness, nausea, and intermittent blurred vision.  She then felt like her vision blacked out, but she continued to hear people talking.  She was able to lay down.  She not actually pass out.  She had no chest pain or shortness of breath with this.  Of note, she then developed worsening headache.  This began fairly gradually but then has worsened.  She has a history of recent fatigue, increased stress, and increased work.  She admits to history of migraines as well and states her current headache feels similar to her prior migraines.  She has had episodes similar to this with her migraines in the past.  She denies any ongoing focal numbness, weakness, or lightheadedness.  No other complaints.  No recent medication changes.       Past Medical History:  Diagnosis Date  . Asthma   . Diabetes mellitus without complication (HCC)   . History of hiatal hernia   . Hypertension   . PONV (postoperative nausea and vomiting)     Patient Active Problem List   Diagnosis Date Noted  . Screen for colon cancer     Past Surgical History:  Procedure Laterality Date  . ABDOMINAL HYSTERECTOMY    . CARPAL TUNNEL RELEASE Right 10/08/2018   Procedure: CARPAL TUNNEL RELEASE ENDOSCOPIC;  Surgeon: Christena Flake, MD;  Location: [facility] ORS;  Service: Orthopedics;  Laterality: Right;  .  CARPAL TUNNEL RELEASE Left 11/05/2018   Procedure: CARPAL TUNNEL RELEASE ENDOSCOPIC;  Surgeon: Christena Flake, MD;  Location: [facility] ORS;  Service: Orthopedics;  Laterality: Left;  . CATARACT EXTRACTION, BILATERAL    . CHOLECYSTECTOMY    . COLONOSCOPY WITH PROPOFOL N/A 04/15/2017   Procedure: COLONOSCOPY WITH PROPOFOL;  Surgeon: Toney Reil, MD;  Location: [facility] ENDOSCOPY;  Service: Gastroenterology;  Laterality: N/A;  . DORSAL COMPARTMENT RELEASE Right 10/08/2018   Procedure: DE QUERVAIN'S TENOSYNOVITIS RELEASE;  Surgeon: Christena Flake, MD;  Location: [facility] ORS;  Service: Orthopedics;  Laterality: Right;  . TONSILLECTOMY      Prior to Admission medications   Medication Sig Start Date End Date Taking? Authorizing Provider  azithromycin (ZITHROMAX Z-PAK) 250 MG tablet Take 2 tablets (500 mg) on  Day 1,  followed by 1 tablet (250 mg) once daily on Days 2 through 5. 03/09/19   Shaune Pollack, MD  butalbital-acetaminophen-caffeine (FIORICET) [phone] MG tablet Take 1-2 tablets by mouth every 6 (six) hours as needed for headache. 03/09/19 03/08/20  Shaune Pollack, MD  HYDROcodone-acetaminophen (NORCO) 5-325 MG tablet Take 1-2 tablets by mouth every 6 (six) hours as needed for moderate pain. MAXIMUM TOTAL ACETAMINOPHEN DOSE IS 4000 MG PER DAY 11/05/18   Poggi, Excell Seltzer, MD  ibuprofen (ADVIL) 200 MG tablet Take 800 mg by mouth every 8 (eight) hours as needed for moderate pain.     [provider]  levothyroxine (SYNTHROID, LEVOTHROID) 125 MCG tablet Take 125 mcg  by mouth daily before breakfast.  04/25/17   [provider]  lisinopril-hydrochlorothiazide (PRINZIDE,ZESTORETIC) 20-25 MG tablet Take 1 tablet by mouth daily. 03/26/16   [provider]  NOVOLOG 100 UNIT/ML injection Inject into the skin continuous. Adjust insulin pump according to carb intake 03/15/16   [provider]    Allergies Morphine and related  Family History  Problem Relation Age of Onset  .  Diabetes Sister   . Diabetes Brother     Social History Social History   Tobacco Use  . Smoking status: Never Smoker  . Smokeless tobacco: Never Used  Substance Use Topics  . Alcohol use: No  . Drug use: No    Review of Systems  Review of Systems  Constitutional: Positive for fatigue. Negative for fever.  HENT: Negative for congestion and sore throat.   Eyes: Negative for visual disturbance.  Respiratory: Negative for cough and shortness of breath.   Cardiovascular: Negative for chest pain.  Gastrointestinal: Positive for nausea. Negative for abdominal pain, diarrhea and vomiting.  Genitourinary: Negative for flank pain.  Musculoskeletal: Negative for back pain and neck pain.  Skin: Negative for rash and wound.  Neurological: Positive for weakness, light-headedness and headaches.  All other systems reviewed and are negative.    ____________________________________________  PHYSICAL EXAM:      VITAL SIGNS: ED Triage Vitals  Enc Vitals Group     BP 03/09/19 1525 (!) 164/83     Pulse Rate 03/09/19 1523 88     Resp 03/09/19 1523 20     Temp 03/09/19 1523 98.7 F (37.1 C)     Temp Source 03/09/19 1523 Oral     SpO2 03/09/19 1523 100 %     Weight 03/09/19 1525 225 lb (102.1 kg)     Height 03/09/19 1525 5\' 2"  (1.575 m)     Head Circumference --      Peak Flow --      Pain Score 03/09/19 1525 9     Pain Loc --      Pain Edu? --      Excl. in GC? --      Physical Exam Vitals and nursing note reviewed.  Constitutional:      General: She is not in acute distress.    Appearance: She is well-developed.  HENT:     Head: Normocephalic and atraumatic.     Mouth/Throat:     Mouth: Mucous membranes are dry.  Eyes:     Conjunctiva/sclera: Conjunctivae normal.  Cardiovascular:     Rate and Rhythm: Normal rate and regular rhythm.     Heart sounds: Normal heart sounds.  Pulmonary:     Effort: Pulmonary effort is normal. No respiratory distress.     Breath sounds: No  wheezing.  Abdominal:     General: There is no distension.  Musculoskeletal:     Cervical back: Neck supple.  Skin:    General: Skin is warm.     Capillary Refill: Capillary refill takes less than 2 seconds.     Findings: No rash.  Neurological:     Mental Status: She is alert and oriented to person, place, and time.     Motor: No abnormal muscle tone.     Comments: Neurological Exam:  Mental Status: Alert and oriented to person, place, and time. Attention and concentration normal. Speech clear. Recent memory is intact. Cranial Nerves: Visual fields grossly intact. EOMI and PERRLA. No nystagmus noted. Facial sensation intact at forehead, maxillary cheek, and  chin/mandible bilaterally. No facial asymmetry or weakness. Hearing grossly normal. Uvula is midline, and palate elevates symmetrically. Normal SCM and trapezius strength. Tongue midline without fasciculations. Motor: Muscle strength 5/5 in proximal and distal UE and LE bilaterally. No pronator drift. Muscle tone normal. Sensation: Intact to light touch in upper and lower extremities distally bilaterally.  Gait: Normal without ataxia. Coordination: Normal FTN bilaterally.          ____________________________________________   LABS (all labs ordered are listed, but only abnormal results are displayed)  Labs Reviewed  BASIC METABOLIC PANEL - Abnormal; Notable for the following components:      Result Value   Glucose, Bld 212 (*)    BUN 25 (*)    Creatinine, Ser 1.36 (*)    GFR calc non Af Amer 44 (*)    GFR calc Af Amer 51 (*)    All other components within normal limits  CBC  TROPONIN I (HIGH SENSITIVITY)  TROPONIN I (HIGH SENSITIVITY)    ____________________________________________  EKG: Normal sinus rhythm, ventricular rate 86.  PR 144, QRS 70, QTc 452.  No acute ST elevations or depressions. ________________________________________  RADIOLOGY All imaging, including plain films, CT scans, and ultrasounds,  independently reviewed by me, and interpretations confirmed via formal radiology reads.  ED MD interpretation:   Chest x-ray: Clear CT head: No acute normality  Official radiology report(s): DG Chest 2 View  Result Date: 03/09/2019 CLINICAL DATA:  Cough EXAM: CHEST - 2 VIEW COMPARISON:  01/25/2013 FINDINGS: The heart size and mediastinal contours are within normal limits. Both lungs are clear. The visualized skeletal structures are unremarkable. IMPRESSION: No active cardiopulmonary disease. Electronically Signed   By: Jasmine Pang M.D.   On: 03/09/2019 17:54   CT Head Wo Contrast  Result Date: 03/09/2019 CLINICAL DATA:  Syncope EXAM: CT HEAD WITHOUT CONTRAST TECHNIQUE: Contiguous axial images were obtained from the base of the skull through the vertex without intravenous contrast. COMPARISON:  None. FINDINGS: Brain: There is no acute intracranial hemorrhage, mass-effect, or edema. Gray-white differentiation is preserved. There is no extra-axial fluid collection. Ventricles and sulci are within normal limits in size and configuration. Vascular: There is atherosclerotic calcification at the skull base. Skull: Calvarium is unremarkable. Sinuses/Orbits: Minor mucosal thickening. No acute orbital abnormality. Other: None. IMPRESSION: No acute intracranial abnormality. Electronically Signed   By: Guadlupe Spanish M.D.   On: 03/09/2019 18:06    ____________________________________________  PROCEDURES   Procedure(s) performed (including Critical Care):  Procedures  ____________________________________________  INITIAL IMPRESSION / MDM / ASSESSMENT AND PLAN / ED COURSE  As part of my medical decision making, I reviewed the following data within the electronic MEDICAL RECORD NUMBER Nursing notes reviewed and incorporated, Old chart reviewed, Notes from prior ED visits, and [place] Controlled Substance Database       *Jennifer Lozano was evaluated in Emergency Department on 03/09/2019 for the symptoms  described in the history of present illness. She was evaluated in the context of the global COVID-19 pandemic, which necessitated consideration that the patient might be at risk for infection with the SARS-CoV-2 virus that causes COVID-19. Institutional protocols and algorithms that pertain to the evaluation of patients at risk for COVID-19 are in a state of rapid change based on information released by regulatory bodies including the CDC and federal and state organizations. These policies and algorithms were followed during the patient's care in the ED.  Some ED evaluations and interventions may be delayed as a result of limited staffing during the  pandemic.*     Medical Decision Making: 54 year old female here with near syncopal episode.  I suspect this was multifactorial.  She has a history of migraines with similar symptoms and near syncopal episodes, which I think is the primary factor, and I suspect this was likely exacerbated by poor p.o. intake, relative dehydration, and standing at work.  She also forgot to take her blood pressure medications which could have contributed to her symptoms.  She also incidentally has recent cough and mild sputum production, which could suggest a developing bronchitis which would further dehydrate her.  Otherwise, she is not anemic, has no arrhythmia, is normal intervals, and I see no evidence of high risk features regarding her syncope.  She has no focal neurological deficits.  Her imaging is unremarkable.  She feels markedly improved after migraine cocktail and is ready for discharge.  Will encouraged hydration, give her a brief course of azithromycin given her history of asthma, compression, and cough, and refer for outpatient follow-up.  ____________________________________________  FINAL CLINICAL IMPRESSION(S) / ED DIAGNOSES  Final diagnoses:  Near syncope  Migraine without aura and without status migrainosus, not intractable     MEDICATIONS GIVEN DURING  THIS VISIT:  Medications  dexamethasone (DECADRON) injection 10 mg (has no administration in time range)  sodium chloride flush (NS) 0.9 % injection 3 mL (3 mLs Intravenous Given 03/09/19 1845)  prochlorperazine (COMPAZINE) injection 10 mg (10 mg Intravenous Given 03/09/19 1843)  diphenhydrAMINE (BENADRYL) injection 25 mg (25 mg Intravenous Given 03/09/19 1845)  sodium chloride 0.9 % bolus 1,000 mL (0 mLs Intravenous Stopped 03/09/19 1922)     ED Discharge Orders         Ordered    azithromycin (ZITHROMAX Z-PAK) 250 MG tablet     03/09/19 1955    butalbital-acetaminophen-caffeine (FIORICET) 50-325-40 MG tablet  Every 6 hours PRN     03/09/19 1955           Note:  This document was prepared using Dragon voice recognition software and may include unintentional dictation errors.   Duffy Bruce, MD 03/09/19 [phone]

## 2019-06-16 ENCOUNTER — Other Ambulatory Visit: Payer: Self-pay | Admitting: Student

## 2019-06-16 DIAGNOSIS — M7712 Lateral epicondylitis, left elbow: Secondary | ICD-10-CM

## 2019-07-15 DIAGNOSIS — U071 COVID-19: Secondary | ICD-10-CM

## 2019-07-15 HISTORY — DX: COVID-19: U07.1

## 2019-07-16 ENCOUNTER — Other Ambulatory Visit: Payer: BLUE CROSS/BLUE SHIELD

## 2019-07-24 ENCOUNTER — Other Ambulatory Visit: Payer: Self-pay

## 2019-07-24 ENCOUNTER — Emergency Department: Payer: BLUE CROSS/BLUE SHIELD

## 2019-07-24 ENCOUNTER — Emergency Department
Admission: EM | Admit: 2019-07-24 | Discharge: 2019-07-25 | Disposition: A | Discharge status: Home / Self Care | Payer: BLUE CROSS/BLUE SHIELD | Attending: Emergency Medicine | Admitting: Emergency Medicine

## 2019-07-24 DIAGNOSIS — E119 Type 2 diabetes mellitus without complications: Secondary | ICD-10-CM | POA: Diagnosis not present

## 2019-07-24 DIAGNOSIS — R05 Cough: Secondary | ICD-10-CM | POA: Diagnosis present

## 2019-07-24 DIAGNOSIS — J45909 Unspecified asthma, uncomplicated: Secondary | ICD-10-CM | POA: Diagnosis not present

## 2019-07-24 DIAGNOSIS — Z79899 Other long term (current) drug therapy: Secondary | ICD-10-CM | POA: Diagnosis not present

## 2019-07-24 DIAGNOSIS — R630 Anorexia: Secondary | ICD-10-CM | POA: Diagnosis not present

## 2019-07-24 DIAGNOSIS — J22 Unspecified acute lower respiratory infection: Secondary | ICD-10-CM | POA: Diagnosis not present

## 2019-07-24 DIAGNOSIS — U071 COVID-19: Secondary | ICD-10-CM

## 2019-07-24 DIAGNOSIS — Z794 Long term (current) use of insulin: Secondary | ICD-10-CM | POA: Diagnosis not present

## 2019-07-24 DIAGNOSIS — I10 Essential (primary) hypertension: Secondary | ICD-10-CM | POA: Diagnosis not present

## 2019-07-24 DIAGNOSIS — Z8616 Personal history of COVID-19: Secondary | ICD-10-CM | POA: Insufficient documentation

## 2019-07-24 LAB — CBC WITH DIFFERENTIAL/PLATELET
Abs Immature Granulocytes: 0.02 10*3/uL (ref 0.00–0.07)
Basophils Absolute: 0 10*3/uL (ref 0.0–0.1)
Basophils Relative: 0 %
Eosinophils Absolute: 0.1 10*3/uL (ref 0.0–0.5)
Eosinophils Relative: 1 %
HCT: 42.7 % (ref 36.0–46.0)
Hemoglobin: 15.1 g/dL — ABNORMAL HIGH (ref 12.0–15.0)
Immature Granulocytes: 0 %
Lymphocytes Relative: 31 %
Lymphs Abs: 2.1 10*3/uL (ref 0.7–4.0)
MCH: 30.8 pg (ref 26.0–34.0)
MCHC: 35.4 g/dL (ref 30.0–36.0)
MCV: 87.1 fL (ref 80.0–100.0)
Monocytes Absolute: 0.7 10*3/uL (ref 0.1–1.0)
Monocytes Relative: 10 %
Neutro Abs: 3.9 10*3/uL (ref 1.7–7.7)
Neutrophils Relative %: 58 %
Platelets: 176 10*3/uL (ref 150–400)
RBC: 4.9 MIL/uL (ref 3.87–5.11)
RDW: 12.5 % (ref 11.5–15.5)
WBC: 6.7 10*3/uL (ref 4.0–10.5)
nRBC: 0 % (ref 0.0–0.2)

## 2019-07-24 LAB — BASIC METABOLIC PANEL
Anion gap: 12 (ref 5–15)
BUN: 23 mg/dL — ABNORMAL HIGH (ref 6–20)
CO2: 25 mmol/L (ref 22–32)
Calcium: 8.6 mg/dL — ABNORMAL LOW (ref 8.9–10.3)
Chloride: 98 mmol/L (ref 98–111)
Creatinine, Ser: 1.28 mg/dL — ABNORMAL HIGH (ref 0.44–1.00)
GFR calc Af Amer: 54 mL/min — ABNORMAL LOW (ref >60)
GFR calc non Af Amer: 47 mL/min — ABNORMAL LOW (ref >60)
Glucose, Bld: 110 mg/dL — ABNORMAL HIGH (ref 70–99)
Potassium: 3.7 mmol/L (ref 3.5–5.1)
Sodium: 135 mmol/L (ref 135–145)

## 2019-07-24 NOTE — ED Notes (Signed)
Patient to stat desk in no acute distress asking about wait time. Patient given update on wait time. Patient walks off yelling that why is she going to come by ems if she is just going to sit in the lobby.

## 2019-07-24 NOTE — ED Triage Notes (Addendum)
Pt to the er for SOB r/t covid that she tested positive on 07/15/19. Pt states she was coughing earlier and she could not stop. Pt husband told her she was turning gray and they went to a [employer].

## 2019-07-24 NOTE — ED Notes (Signed)
Patient given update on wait time. Patient verbalizes understanding.  

## 2019-07-24 NOTE — ED Triage Notes (Signed)
First Nurse: patient brought in by ems from home. Patient tested positive for Covid on 07/15/19. Patient here with complaint of shortness of breath. Per ems lung sounds clear, 98% room air, respiratory rate 22, heart rate 100 sinus rhythm, blood pressure 130/90.

## 2019-07-25 ENCOUNTER — Telehealth: Payer: Self-pay | Admitting: Adult Health

## 2019-07-25 ENCOUNTER — Encounter: Payer: Self-pay | Admitting: Emergency Medicine

## 2019-07-25 MED ORDER — PREDNISONE 10 MG PO TABS: ORAL_TABLET | ORAL | 0 refills | Status: DC

## 2019-07-25 NOTE — Discharge Instructions (Signed)
As we discussed, although you have tested positive for COVID-19 (coronavirus), you do not need to be hospitalized at this time.  Read through all the included information including the recommendations from the CDC.  We recommend that you self-quarantine at home with your immediate family only (people with whom you have already been in contact) for 10-14 days after your fever has gone away (without taking medication to make your temperature come down, such as Tylenol (acetaminophen)), after your respiratory symptoms have improved, and after at least 14 days have passed since your symptoms first appeared.  You should have as minimal contact as possible with anyone else including close family as per the [facility] paperwork guidelines listed below. Follow-up with your doctor by phone or online as needed and return immediately to the emergency department or call 911 only if you develop new or worsening symptoms that concern you.  If you were prescribed any medications, please use them as instructed.  Please note that I called the [facility] infusion center and left them a message with your name and medical record number, but I still encourage you to call yourself and leave a message with your phone number and to confirm your desire to follow-up with them.  You can let them know that you were seen in the [facility] regional emergency department overnight on Friday night and were referred to them.  You can find up-to-date information about COVID-19 in [place] by calling the [employer] Helpline: [phone]. You may also call 2-1-1, or [phone], or additional resources.  You can also find information online at PureLoser.gl, or on the Center for Disease Control (CDC) website at http://bradshaw.com/.

## 2019-07-25 NOTE — Telephone Encounter (Signed)
Received referral from the ER about Jennifer Lozano to screen for eligibility for monoclonal antibody therapy for COVID 19 infection.  She meets criteria based on her h/o diabetes and BMI.  It is unclear when her symptom onset began, as Monoclonal antibody therapy is only approved to be administered within the first 10 days of symptom onset.  I attempted to call her and discuss and determine, however, there was no answer and unable to leave voice mail.    Message sent with call back number.    Lillard Anes, NP

## 2019-07-25 NOTE — ED Provider Notes (Signed)
[facility] Emergency Department Provider Note  ____________________________________________   First MD Initiated Contact with Patient 07/24/19 2343     (approximate)  I have reviewed the triage vital signs and the nursing notes.   HISTORY  Chief Complaint Shortness of Breath    HPI Jennifer Lozano is a 54 y.o. female with medical history as listed below which notably includes a diagnosis of COVID-19 about 10 days ago as an outpatient.  She presents tonight after a severe and acute in onset coughing episode that caused her to not be able to catch her breath.  She feels much better now although she has a persistent cough.  She took a course of azithromycin prescribed by the urgent care provider that diagnosed originally as well as a short course of prednisone which she comments substantially elevated her blood glucose (she is an insulin-dependent diabetic).  She is done with the prednisone and has plenty of cough medicine at home.  Tonight was a particular strong episode of coughing but in general she is not short of breath.  She feels very fatigued and is having generalized body aches and pains, mild sore throat, and decreased appetite although she says she is forcing herself to eat and drink.  Nothing particular makes her better or worse and her symptoms are severe.         Past Medical History:  Diagnosis Date  . Asthma   . COVID-19 07/15/2019   diagnosed in outpatient clinic on 07/15/2019  . Diabetes mellitus without complication (HCC)   . History of hiatal hernia   . Hypertension   . PONV (postoperative nausea and vomiting)     Patient Active Problem List   Diagnosis Date Noted  . Screen for colon cancer     Past Surgical History:  Procedure Laterality Date  . ABDOMINAL HYSTERECTOMY    . CARPAL TUNNEL RELEASE Right 10/08/2018   Procedure: CARPAL TUNNEL RELEASE ENDOSCOPIC;  Surgeon: Christena Flake, MD;  Location: [facility] ORS;  Service: Orthopedics;   Laterality: Right;  . CARPAL TUNNEL RELEASE Left 11/05/2018   Procedure: CARPAL TUNNEL RELEASE ENDOSCOPIC;  Surgeon: Christena Flake, MD;  Location: [facility] ORS;  Service: Orthopedics;  Laterality: Left;  . CATARACT EXTRACTION, BILATERAL    . CHOLECYSTECTOMY    . COLONOSCOPY WITH PROPOFOL N/A 04/15/2017   Procedure: COLONOSCOPY WITH PROPOFOL;  Surgeon: Toney Reil, MD;  Location: [facility] ENDOSCOPY;  Service: Gastroenterology;  Laterality: N/A;  . DORSAL COMPARTMENT RELEASE Right 10/08/2018   Procedure: DE QUERVAIN'S TENOSYNOVITIS RELEASE;  Surgeon: Christena Flake, MD;  Location: [facility] ORS;  Service: Orthopedics;  Laterality: Right;  . TONSILLECTOMY      Prior to Admission medications   Medication Sig Start Date End Date Taking? Authorizing Provider  azithromycin (ZITHROMAX Z-PAK) 250 MG tablet Take 2 tablets (500 mg) on  Day 1,  followed by 1 tablet (250 mg) once daily on Days 2 through 5. 03/09/19   Shaune Pollack, MD  butalbital-acetaminophen-caffeine (FIORICET) [phone] MG tablet Take 1-2 tablets by mouth every 6 (six) hours as needed for headache. 03/09/19 03/08/20  Shaune Pollack, MD  HYDROcodone-acetaminophen (NORCO) 5-325 MG tablet Take 1-2 tablets by mouth every 6 (six) hours as needed for moderate pain. MAXIMUM TOTAL ACETAMINOPHEN DOSE IS 4000 MG PER DAY 11/05/18   Poggi, Excell Seltzer, MD  ibuprofen (ADVIL) 200 MG tablet Take 800 mg by mouth every 8 (eight) hours as needed for moderate pain.     [provider]  levothyroxine (  SYNTHROID, LEVOTHROID) 125 MCG tablet Take 125 mcg by mouth daily before breakfast.  04/25/17   [provider]  lisinopril-hydrochlorothiazide (PRINZIDE,ZESTORETIC) 20-25 MG tablet Take 1 tablet by mouth daily. 03/26/16   [provider]  NOVOLOG 100 UNIT/ML injection Inject into the skin continuous. Adjust insulin pump according to carb intake 03/15/16   [provider]  predniSONE (DELTASONE) 10 MG tablet Take 4 tabs (40 mg) PO x 3 days, then  take 2 tabs (20 mg) PO x 3 days, then take 1 tab (10 mg) PO x 3 days, then take 1/2 tab (5 mg) PO x 4 days. 07/25/19   Loleta Rose, MD    Allergies Morphine and related  Family History  Problem Relation Age of Onset  . Diabetes Sister   . Diabetes Brother     Social History Social History   Tobacco Use  . Smoking status: Never Smoker  . Smokeless tobacco: Never Used  Vaping Use  . Vaping Use: Never used  Substance Use Topics  . Alcohol use: No  . Drug use: No    Review of Systems Constitutional: +fever/chills Eyes: No visual changes. ENT: +sore throat. Cardiovascular: Denies chest pain. Respiratory: Persistent cough, shortness of breath associated with the cough only. Gastrointestinal: No abdominal pain.  No nausea, no vomiting.  No diarrhea.  No constipation.  Decreased appetite. Genitourinary: Negative for dysuria. Musculoskeletal: Negative for neck pain.  Negative for back pain. Integumentary: Negative for rash. Neurological: Negative for headaches, focal weakness or numbness.   ____________________________________________   PHYSICAL EXAM:  VITAL SIGNS: ED Triage Vitals  Enc Vitals Group     BP 07/24/19 1916 (!) 126/112     Pulse Rate 07/24/19 1916 96     Resp 07/24/19 1916 18     Temp 07/24/19 1916 98.2 F (36.8 C)     Temp Source 07/24/19 1916 Oral     SpO2 07/24/19 1916 100 %     Weight 07/24/19 1917 108.9 kg (240 lb)     Height 07/24/19 1917 1.575 m (5\' 2" )     Head Circumference --      Peak Flow --      Pain Score 07/24/19 1916 8     Pain Loc --      Pain Edu? --      Excl. in GC? --     Constitutional: Alert and oriented.  Eyes: Conjunctivae are normal.  Head: Atraumatic. Nose: No congestion/rhinnorhea. Mouth/Throat: Patient is wearing a mask. Neck: No stridor.  No meningeal signs.   Cardiovascular: Normal rate, regular rhythm. Good peripheral circulation. Grossly normal heart sounds. Respiratory: Frequent cough but with normal  respiratory effort.  No retractions. Gastrointestinal: Soft and nontender. No distention.  Musculoskeletal: No lower extremity tenderness nor edema. No gross deformities of extremities. Neurologic:  Normal speech and language. No gross focal neurologic deficits are appreciated.  Skin:  Skin is warm, dry and intact. Psychiatric: Mood and affect are normal. Speech and behavior are normal.  ____________________________________________   LABS (all labs ordered are listed, but only abnormal results are displayed)  Labs Reviewed  CBC WITH DIFFERENTIAL/PLATELET - Abnormal; Notable for the following components:      Result Value   Hemoglobin 15.1 (*)    All other components within normal limits  BASIC METABOLIC PANEL - Abnormal; Notable for the following components:   Glucose, Bld 110 (*)    BUN 23 (*)    Creatinine, Ser 1.28 (*)    Calcium 8.6 (*)  GFR calc non Af Amer 47 (*)    GFR calc Af Amer 54 (*)    All other components within normal limits   ____________________________________________  EKG  No indication for emergent EKG ____________________________________________  RADIOLOGY I, Loleta Rose, personally viewed and evaluated these images (plain radiographs) as part of my medical decision making, as well as reviewing the written report by the radiologist.  ED MD interpretation: Possible bilateral upper lobe opacities consistent with COVID-19 pneumonia but relatively minimal.  Official radiology report(s): DG Chest 2 View  Result Date: 07/24/2019 CLINICAL DATA:  Cough, COVID-19. EXAM: CHEST - 2 VIEW COMPARISON:  March 09, 2019.  January 25, 2013. FINDINGS: The heart size and mediastinal contours are within normal limits. No pneumothorax or pleural effusion is noted. Possible minimal bilateral upper lobe opacities are noted concerning for multifocal pneumonia. The visualized skeletal structures are unremarkable. IMPRESSION: Possible minimal bilateral upper lobe opacities are  noted concerning for multifocal pneumonia. Electronically Signed   By: Lupita Raider M.D.   On: 07/24/2019 19:51    ____________________________________________   PROCEDURES   Procedure(s) performed (including Critical Care):  Procedures   ____________________________________________   INITIAL IMPRESSION / MDM / ASSESSMENT AND PLAN / ED COURSE  As part of my medical decision making, I reviewed the following data within the electronic MEDICAL RECORD NUMBER Nursing notes reviewed and incorporated, Labs reviewed , Old chart reviewed, Radiograph reviewed  and Notes from prior ED visits   Differential diagnosis includes, but is not limited to, COVID-19 and sequela, community-acquired pneumonia, mucous plugging, COPD or asthma.  The patient is not a smoker but she has insulin-dependent diabetes and is morbidly obese.  She may benefit from outpatient COVID-19 antibiotic infusion.  Fortunately she is stable tonight with reassuring vital signs, only mild tachycardia that improves when she is not coughing, and her oxygen saturation has remained in the upper 90s.  She is not short of breath except when she has a cough.  Her metabolic panel and CBC are essentially normal or at least at her baseline (she has an elevated creatinine at baseline).  I went over my usual customary COVID-19 recommendations and return precautions.  We talked about the effect the prednisone has on her glucose but I recommended that we try a longer course of prednisone this time since she is still symptomatic after about 10+ days and she agrees and will adjust her insulin appropriately.  I also gave her the follow-up information with the [facility] so she can call them to talk to them about the possibility of antibody infusions.  She understands and agrees with the plan and states that she has plenty of cough medicine at home.  To facilitate follow-up, I also called and left a message with the [facility] with the patient contact information.          ____________________________________________  FINAL CLINICAL IMPRESSION(S) / ED DIAGNOSES  Final diagnoses:  Lower respiratory tract infection due to COVID-19 virus     MEDICATIONS GIVEN DURING THIS VISIT:  Medications - No data to display   ED Discharge Orders         Ordered    predniSONE (DELTASONE) 10 MG tablet     Discontinue  Reprint     07/25/19 0022          *Please note:  Jennifer Lozano was evaluated in Emergency Department on 07/25/2019 for the symptoms described in the history of present illness. She  was evaluated in the context of the global COVID-19 pandemic, which necessitated consideration that the patient might be at risk for infection with the SARS-CoV-2 virus that causes COVID-19. Institutional protocols and algorithms that pertain to the evaluation of patients at risk for COVID-19 are in a state of rapid change based on information released by regulatory bodies including the CDC and federal and state organizations. These policies and algorithms were followed during the patient's care in the ED.  Some ED evaluations and interventions may be delayed as a result of limited staffing during and after the pandemic.*  Note:  This document was prepared using Dragon voice recognition software and may include unintentional dictation errors.   Loleta RoseForbach, Maley Venezia, MD 07/25/19 [phone]

## 2021-11-11 IMAGING — CT CT HEAD W/O CM
3 series · 15 of 47 positions shown, 18 images · non-contrast
Comparison: None.

CLINICAL DATA: Syncope

EXAM:
CT HEAD WITHOUT CONTRAST
TECHNIQUE: Contiguous axial images were obtained from the base of the skull
through the vertex without intravenous contrast.

[Series 2: head wo · axial · 0.42mm/px · z∈[-131,-6]mm · 9 of 31 slices shown, 12 images]
[im 3/31  brain]
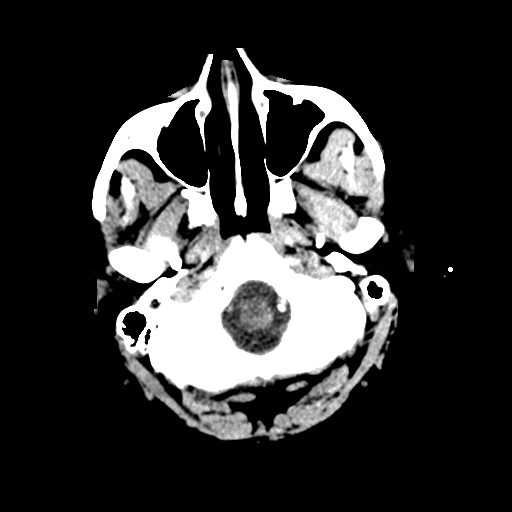
[im 3/31  bone]
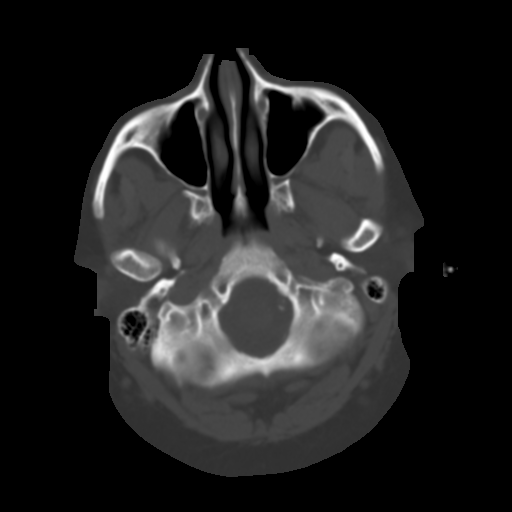
[im 6/31  brain]
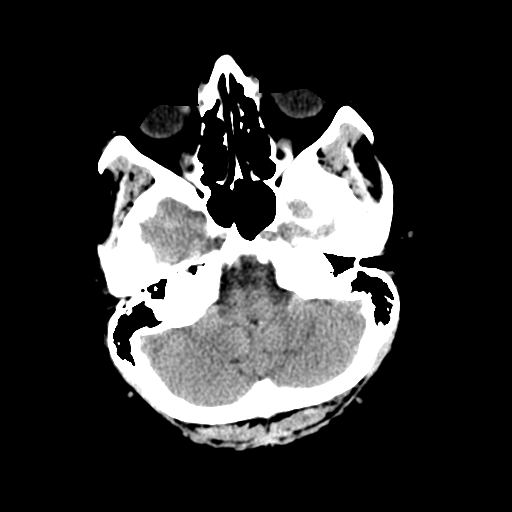
[im 9/31  brain]
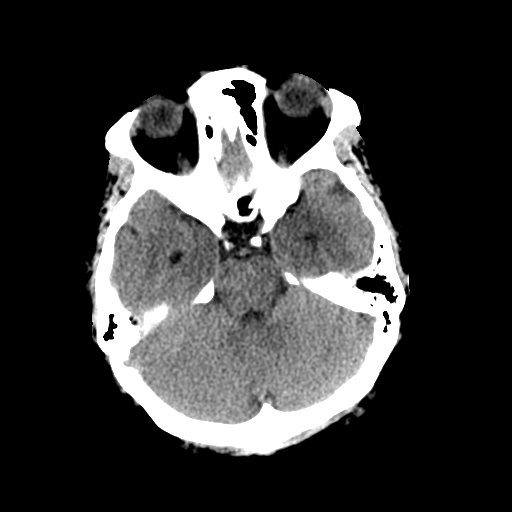
[im 12/31  brain]
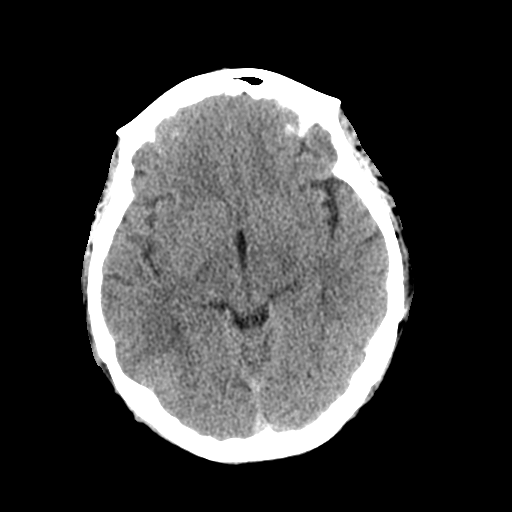
[im 16/31  brain]
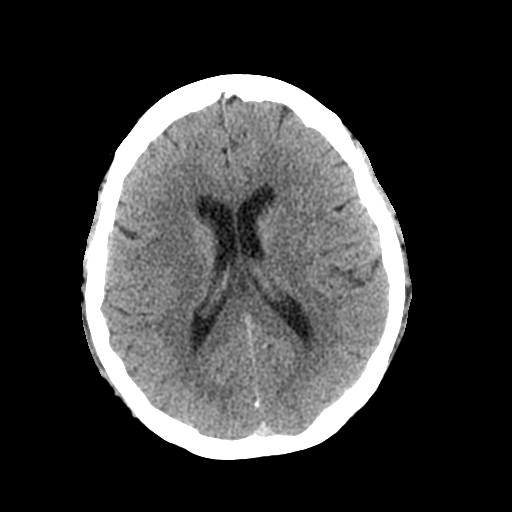
[im 16/31  bone]
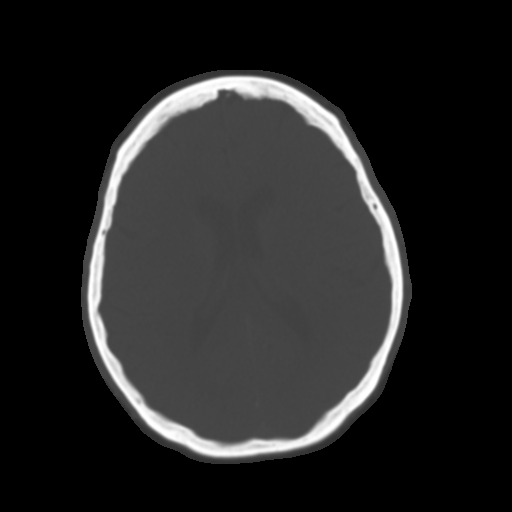
[im 19/31  brain]
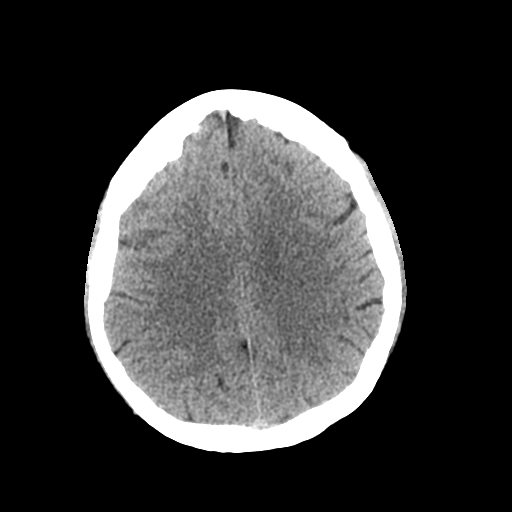
[im 22/31  brain]
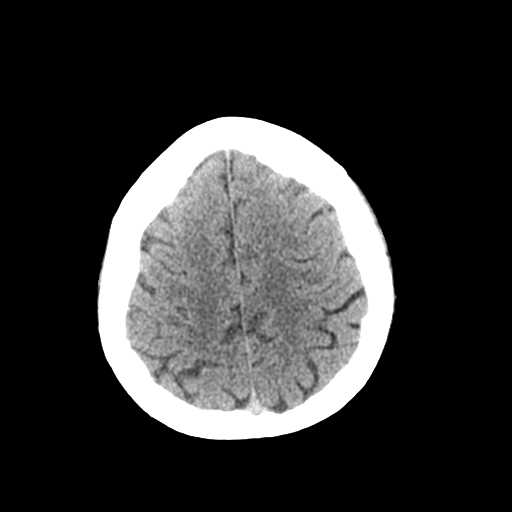
[im 25/31  brain]
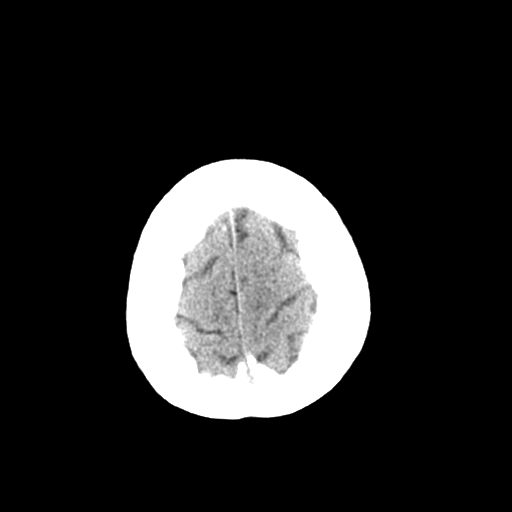
[im 28/31  brain]
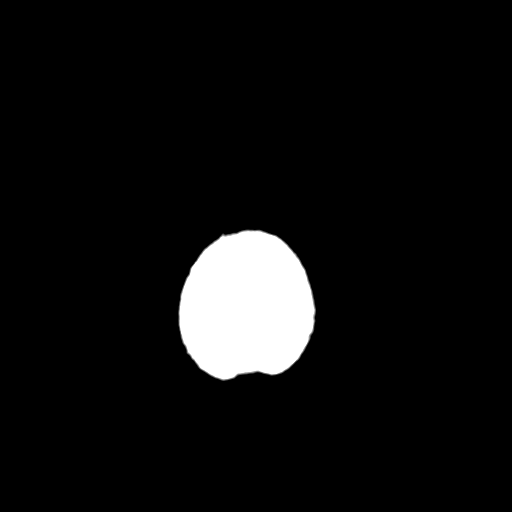
[im 28/31  bone]
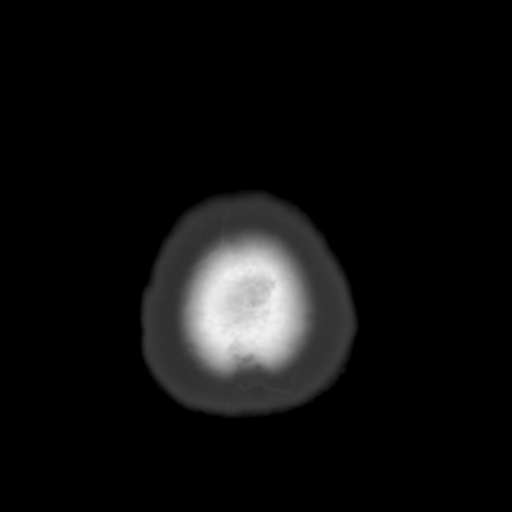

[Series 4: coronal soft tissue · coronal · 0.27mm/px · 3 of 57 slices shown]
[im 19/57  brain]
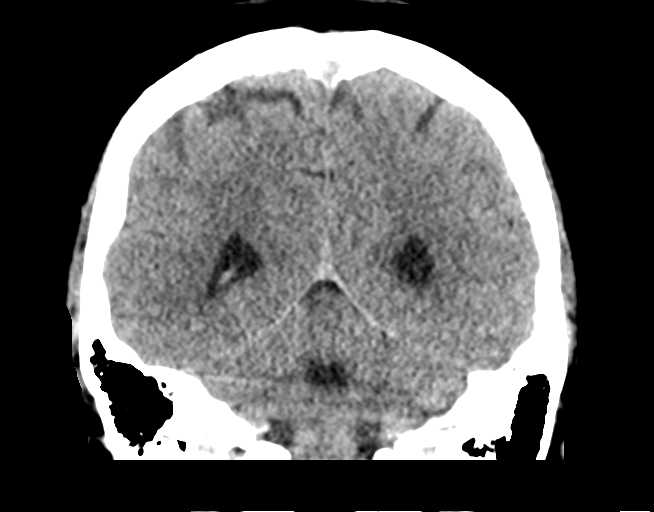
[im 25/57  brain]
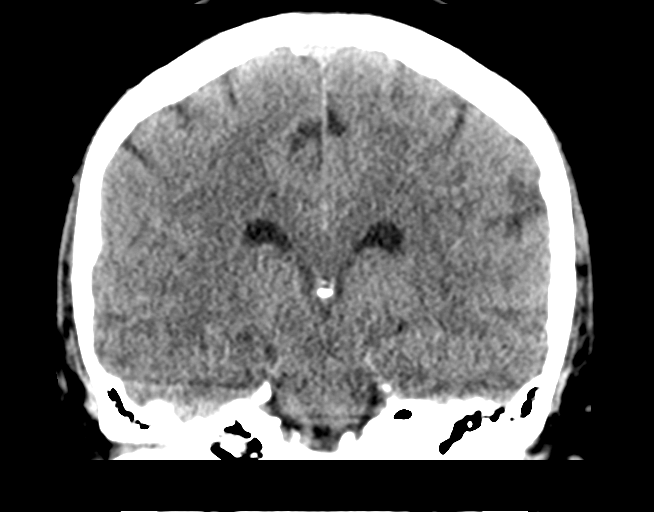
[im 32/57  brain]
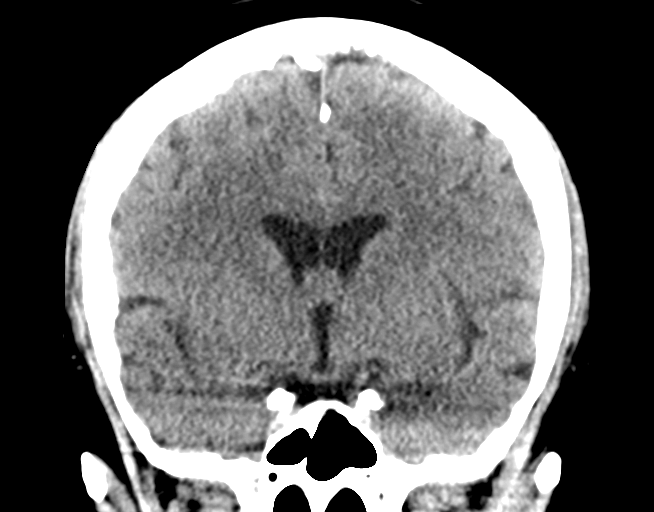

[Series 5: sagittal soft tissue · sagittal · 0.28mm/px · 3 of 52 slices shown]
[im 18/52  brain]
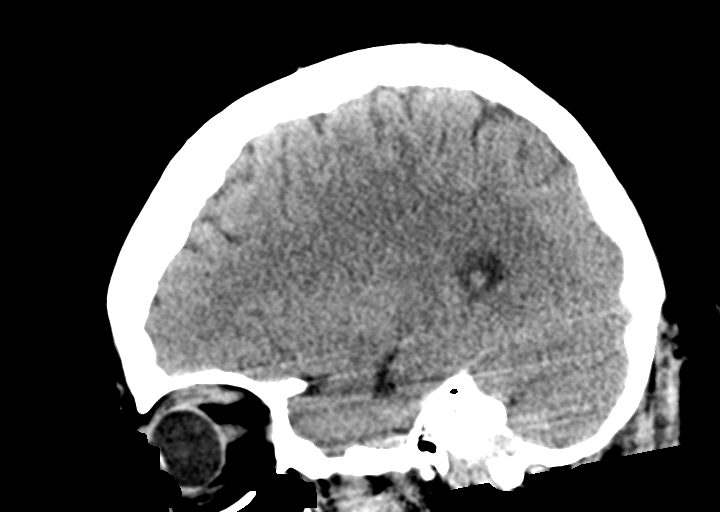
[im 26/52  brain]
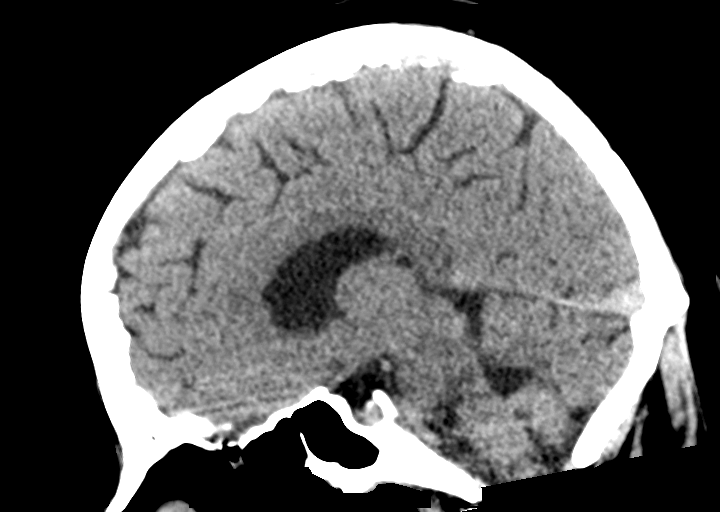
[im 35/52  brain]
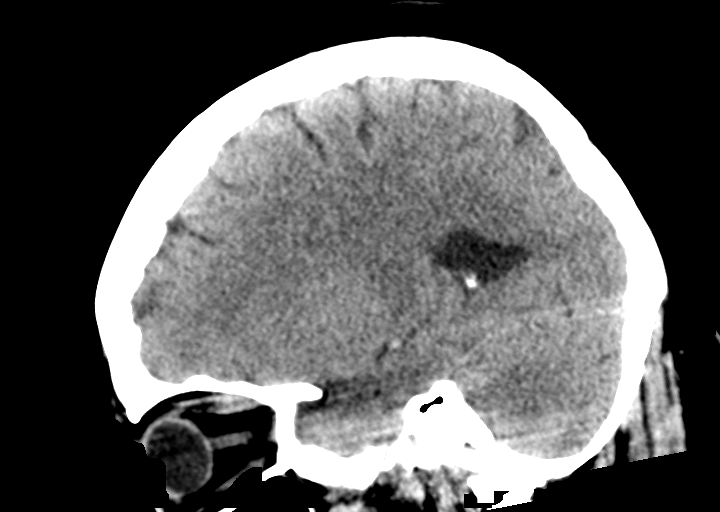

[15 of 47 positions shown; findings below may reference images not displayed]

FINDINGS: Brain: There is no acute intracranial hemorrhage, mass-effect, or
edema. Gray-white differentiation is preserved. There is no
extra-axial fluid collection. Ventricles and sulci are within normal
limits in size and configuration.

Vascular: There is atherosclerotic calcification at the skull base.

Skull: Calvarium is unremarkable.

Sinuses/Orbits: Minor mucosal thickening. No acute orbital
abnormality.

Other: None.
IMPRESSION: No acute intracranial abnormality.

## 2022-08-30 ENCOUNTER — Encounter: Payer: Self-pay | Admitting: Gastroenterology

## 2022-08-30 ENCOUNTER — Other Ambulatory Visit: Payer: Self-pay | Admitting: Gastroenterology

## 2022-08-30 DIAGNOSIS — K219 Gastro-esophageal reflux disease without esophagitis: Secondary | ICD-10-CM

## 2022-08-30 DIAGNOSIS — R197 Diarrhea, unspecified: Secondary | ICD-10-CM

## 2022-08-30 DIAGNOSIS — K5909 Other constipation: Secondary | ICD-10-CM

## 2022-08-31 ENCOUNTER — Ambulatory Visit
Admission: RE | Admit: 2022-08-31 | Discharge: 2022-08-31 | Disposition: A | Discharge status: Home / Self Care | Payer: Medicaid Other | Source: Ambulatory Visit | Attending: Gastroenterology | Admitting: Gastroenterology

## 2022-08-31 DIAGNOSIS — K5909 Other constipation: Secondary | ICD-10-CM

## 2022-08-31 DIAGNOSIS — K219 Gastro-esophageal reflux disease without esophagitis: Secondary | ICD-10-CM

## 2022-08-31 DIAGNOSIS — R197 Diarrhea, unspecified: Secondary | ICD-10-CM

## 2022-08-31 MED ORDER — IOPAMIDOL (ISOVUE-300) INJECTION 61%
100.0000 mL | Freq: Once | INTRAVENOUS | Status: AC | PRN
  Administered 2022-08-31: 100 mL via INTRAVENOUS

## 2023-07-20 ENCOUNTER — Other Ambulatory Visit: Payer: Self-pay

## 2023-07-20 ENCOUNTER — Emergency Department
Admission: EM | Admit: 2023-07-20 | Discharge: 2023-07-20 | Disposition: A | Discharge status: Home / Self Care | Attending: Emergency Medicine | Admitting: Emergency Medicine

## 2023-07-20 DIAGNOSIS — E119 Type 2 diabetes mellitus without complications: Secondary | ICD-10-CM | POA: Diagnosis not present

## 2023-07-20 DIAGNOSIS — Z8616 Personal history of COVID-19: Secondary | ICD-10-CM | POA: Insufficient documentation

## 2023-07-20 DIAGNOSIS — I10 Essential (primary) hypertension: Secondary | ICD-10-CM | POA: Insufficient documentation

## 2023-07-20 DIAGNOSIS — K2900 Acute gastritis without bleeding: Secondary | ICD-10-CM | POA: Diagnosis not present

## 2023-07-20 DIAGNOSIS — J45909 Unspecified asthma, uncomplicated: Secondary | ICD-10-CM | POA: Insufficient documentation

## 2023-07-20 DIAGNOSIS — R197 Diarrhea, unspecified: Secondary | ICD-10-CM | POA: Diagnosis present

## 2023-07-20 LAB — COMPREHENSIVE METABOLIC PANEL WITH GFR
ALT: 20 U/L (ref 0–44)
AST: 25 U/L (ref 15–41)
Albumin: 4.1 g/dL (ref 3.5–5.0)
Alkaline Phosphatase: 78 U/L (ref 38–126)
Anion gap: 8 (ref 5–15)
BUN: 28 mg/dL — ABNORMAL HIGH (ref 6–20)
CO2: 27 mmol/L (ref 22–32)
Calcium: 9 mg/dL (ref 8.9–10.3)
Chloride: 104 mmol/L (ref 98–111)
Creatinine, Ser: 1.27 mg/dL — ABNORMAL HIGH (ref 0.44–1.00)
GFR, Estimated: 48 mL/min — ABNORMAL LOW (ref >60)
Glucose, Bld: 173 mg/dL — ABNORMAL HIGH (ref 70–99)
Potassium: 4.1 mmol/L (ref 3.5–5.1)
Sodium: 139 mmol/L (ref 135–145)
Total Bilirubin: 0.7 mg/dL (ref 0.0–1.2)
Total Protein: 8.4 g/dL — ABNORMAL HIGH (ref 6.5–8.1)

## 2023-07-20 LAB — URINALYSIS, ROUTINE W REFLEX MICROSCOPIC
Bilirubin Urine: NEGATIVE
Glucose, UA: NEGATIVE mg/dL
Hgb urine dipstick: NEGATIVE
Ketones, ur: NEGATIVE mg/dL
Leukocytes,Ua: NEGATIVE
Nitrite: NEGATIVE
Protein, ur: NEGATIVE mg/dL
Specific Gravity, Urine: 1.016 (ref 1.005–1.030)
pH: 5 (ref 5.0–8.0)

## 2023-07-20 LAB — CBC
HCT: 41.5 % (ref 36.0–46.0)
Hemoglobin: 13.9 g/dL (ref 12.0–15.0)
MCH: 30.2 pg (ref 26.0–34.0)
MCHC: 33.5 g/dL (ref 30.0–36.0)
MCV: 90.2 fL (ref 80.0–100.0)
Platelets: 262 K/uL (ref 150–400)
RBC: 4.6 MIL/uL (ref 3.87–5.11)
RDW: 12.7 % (ref 11.5–15.5)
WBC: 12.5 K/uL — ABNORMAL HIGH (ref 4.0–10.5)
nRBC: 0.2 % (ref 0.0–0.2)

## 2023-07-20 LAB — LIPASE, BLOOD: Lipase: 33 U/L (ref 11–51)

## 2023-07-20 MED ORDER — ONDANSETRON HCL 4 MG/2ML IJ SOLN
4.0000 mg | Freq: Once | INTRAMUSCULAR | Status: AC
  Administered 2023-07-20: 4 mg via INTRAVENOUS
  Filled 2023-07-20: qty 2

## 2023-07-20 MED ORDER — PANTOPRAZOLE SODIUM 40 MG IV SOLR
40.0000 mg | Freq: Once | INTRAVENOUS | Status: AC
  Administered 2023-07-20: 40 mg via INTRAVENOUS
  Filled 2023-07-20: qty 10

## 2023-07-20 MED ORDER — FAMOTIDINE 20 MG PO TABS: 20.0000 mg | ORAL_TABLET | Freq: Two times a day (BID) | ORAL | 0 refills | Status: DC

## 2023-07-20 MED ORDER — SODIUM CHLORIDE 0.9 % IV BOLUS
1000.0000 mL | Freq: Once | INTRAVENOUS | Status: AC
  Administered 2023-07-20: 1000 mL via INTRAVENOUS

## 2023-07-20 MED ORDER — ONDANSETRON 4 MG PO TBDP
4.0000 mg | ORAL_TABLET | Freq: Three times a day (TID) | ORAL | 0 refills | Status: AC | PRN
Start: 2023-07-20 — End: ?

## 2023-07-20 MED ORDER — KETOROLAC TROMETHAMINE 15 MG/ML IJ SOLN
15.0000 mg | Freq: Once | INTRAMUSCULAR | Status: AC
  Administered 2023-07-20: 15 mg via INTRAVENOUS
  Filled 2023-07-20: qty 1

## 2023-07-20 NOTE — ED Triage Notes (Signed)
 To ED for NVD (3-4 times each day) and mid abdominal cramping pain since 5 days. Passed a kidney stone this week. Today she fainted 3 times while walking to bathroom. States is very nauseous.  Skin is dry. Respirations unlabored.

## 2023-07-20 NOTE — ED Notes (Signed)
 ED Provider at bedside.

## 2023-07-20 NOTE — ED Notes (Signed)
 Crackers and beverage given for PO challenge per MD

## 2023-07-20 NOTE — ED Provider Notes (Signed)
 [facility] Provider Note    Event Date/Time   First MD Initiated Contact with Patient 07/20/23 1520     (approximate)   History   Chief Complaint: Emesis, Diarrhea, Loss of Consciousness, and Abdominal Pain   HPI  Jennifer Lozano is a 58 y.o. female with a history of diabetes, hypertension who comes ED complaining of nausea vomiting diarrhea for the past 3 days.  She reports 5 days ago she went to urgent care due to flank pain, was told she had a kidney stone.  She subsequently passed that stone and flank pain resolved.  Denies fever or chills or any specific pain currently.  3 days ago she reports having cramping abdominal pain intermittently with some vomiting and watery diarrhea.  Worse with trying to eat.  Today she has been able to tolerate liquids and Ramen soup but still feels nauseated.  She also notes that she passed out at home 3 times today.  This has been a recurrent issue for her for which her doctors evaluating.  Whenever she has episodes of an upset stomach, she tends to also have passing out spells.  Denies any associated chest pain shortness of breath severe headache or sharp radiating abdominal pain      Past Medical History:  Diagnosis Date   Asthma    COVID-19 07/15/2019   diagnosed in outpatient clinic on 07/15/2019   Diabetes mellitus without complication (HCC)    History of hiatal hernia    Hypertension    PONV (postoperative nausea and vomiting)     Current Outpatient Rx   Order #: [id number] Class: Print   Order #: [id number] Class: Print   Order #: [id number] Class: Print   Order #: [id number] Class: Print   Order #: [id number] Class: Historical Med   Order #: [id number] Class: Historical Med   Order #: [id number] Class: Historical Med   Order #: [id number] Class: Historical Med   Order #: [id number] Class: Print    Past Surgical History:  Procedure Laterality Date   ABDOMINAL HYSTERECTOMY     CARPAL TUNNEL RELEASE Right 10/08/2018    Procedure: CARPAL TUNNEL RELEASE ENDOSCOPIC;  Surgeon: Edie Norleen PARAS, MD;  Location: [facility] ORS;  Service: Orthopedics;  Laterality: Right;   CARPAL TUNNEL RELEASE Left 11/05/2018   Procedure: CARPAL TUNNEL RELEASE ENDOSCOPIC;  Surgeon: Edie Norleen PARAS, MD;  Location: [facility] ORS;  Service: Orthopedics;  Laterality: Left;   CATARACT EXTRACTION, BILATERAL     CHOLECYSTECTOMY     COLONOSCOPY WITH PROPOFOL  N/A 04/15/2017   Procedure: COLONOSCOPY WITH PROPOFOL ;  Surgeon: Unk Corinn Skiff, MD;  Location[facility] ENDOSCOPY;  Service: Gastroenterology;  Laterality: N/A;   DORSAL COMPARTMENT RELEASE Right 10/08/2018   Procedure: DE QUERVAIN'S TENOSYNOVITIS RELEASE;  Surgeon: Edie Norleen PARAS, MD;  Location: [facility] ORS;  Service: Orthopedics;  Laterality: Right;   TONSILLECTOMY      Physical Exam   Triage Vital Signs: ED Triage Vitals  Encounter Vitals Group     BP 07/20/23 1425 137/84     Girls Systolic BP Percentile --      Girls Diastolic BP Percentile --      Boys Systolic BP Percentile --      Boys Diastolic BP Percentile --      Pulse Rate 07/20/23 1425 96     Resp 07/20/23 1425 20     Temp 07/20/23 1425 98.3 F (36.8 C)     Temp Source 07/20/23 1425 Oral     SpO2 07/20/23 1425 99 %     Weight  07/20/23 1424 222 lb (100.7 kg)     Height 07/20/23 1424 5' 3 (1.6 m)     Head Circumference --      Peak Flow --      Pain Score 07/20/23 1423 7     Pain Loc --      Pain Education --      Exclude from Growth Chart --     Most recent vital signs: Vitals:   07/20/23 1630 07/20/23 1700  BP: (!) 103/45 (!) 101/48  Pulse: 64 62  Resp: (!) 22 18  Temp:    SpO2: 97% 100%    General: Awake, no distress.  CV:  Good peripheral perfusion.  Regular rate rhythm Resp:  Normal effort.  Clear to auscultation Abd:  No distention.  Soft with mild left upper quadrant tenderness Other:  Dry oral mucosa   ED Results / Procedures / Treatments   Labs (all labs ordered are listed, but only abnormal results  are displayed) Labs Reviewed  COMPREHENSIVE METABOLIC PANEL WITH GFR - Abnormal; Notable for the following components:      Result Value   Glucose, Bld 173 (*)    BUN 28 (*)    Creatinine, Ser 1.27 (*)    Total Protein 8.4 (*)    GFR, Estimated 48 (*)    All other components within normal limits  CBC - Abnormal; Notable for the following components:   WBC 12.5 (*)    All other components within normal limits  URINALYSIS, ROUTINE W REFLEX MICROSCOPIC - Abnormal; Notable for the following components:   Color, Urine YELLOW (*)    APPearance HAZY (*)    All other components within normal limits  LIPASE, BLOOD     EKG Interpreted by me Sinus rhythm rate of 87.  Normal axis and intervals.  Poor R wave progression.  Normal ST segments and T waves   RADIOLOGY    PROCEDURES:  Procedures   MEDICATIONS ORDERED IN ED: Medications  sodium chloride  0.9 % bolus 1,000 mL (0 mLs Intravenous Stopped 07/20/23 1733)  ondansetron  (ZOFRAN ) injection 4 mg (4 mg Intravenous Given 07/20/23 1611)  ketorolac  (TORADOL ) 15 MG/ML injection 15 mg (15 mg Intravenous Given 07/20/23 1614)  pantoprazole  (PROTONIX ) injection 40 mg (40 mg Intravenous Given 07/20/23 1614)     IMPRESSION / MDM / ASSESSMENT AND PLAN / ED COURSE  I reviewed the triage vital signs and the nursing notes.  DDx: Dehydration, electrolyte abnormality, anemia, pancreatitis, viral gastroenteritis, UTI  Patient's presentation is most consistent with acute presentation with potential threat to life or bodily function.  Patient presents with abdominal pain, vomiting and diarrhea for the last few days but improved today.  She does appear dehydrated.  She did have 3 episodes of syncope at home which is something of a chronic recurrent issue for her that is being evaluated by her doctor.  Doubt stroke, ACS PE dissection, aneurysm.  Labs reassuring.  Will give IV fluids, Protonix  Zofran  Toradol  and  reassess.    ----------------------------------------- 6:15 PM on 07/20/2023 ----------------------------------------- Feels better.  Abdomen soft and nontender.  Tolerating oral intake.  Stable for discharge     FINAL CLINICAL IMPRESSION(S) / ED DIAGNOSES   Final diagnoses:  Acute gastritis without hemorrhage, unspecified gastritis type  Type 2 diabetes mellitus without complication, with long-term current use of insulin (HCC)     Rx / DC Orders   ED Discharge Orders          Ordered  famotidine  (PEPCID ) 20 MG tablet  2 times daily        07/20/23 1815    ondansetron  (ZOFRAN -ODT) 4 MG disintegrating tablet  Every 8 hours PRN        07/20/23 1815             Note:  This document was prepared using Dragon voice recognition software and may include unintentional dictation errors.   Viviann Pastor, MD 07/20/23 1816

## 2023-08-06 ENCOUNTER — Encounter (INDEPENDENT_AMBULATORY_CARE_PROVIDER_SITE_OTHER): Payer: Self-pay | Admitting: Nurse Practitioner

## 2023-08-06 ENCOUNTER — Encounter (INDEPENDENT_AMBULATORY_CARE_PROVIDER_SITE_OTHER)

## 2023-08-16 ENCOUNTER — Other Ambulatory Visit (INDEPENDENT_AMBULATORY_CARE_PROVIDER_SITE_OTHER): Payer: Self-pay | Admitting: Vascular Surgery

## 2023-08-16 DIAGNOSIS — R55 Syncope and collapse: Secondary | ICD-10-CM

## 2023-08-18 DIAGNOSIS — R55 Syncope and collapse: Secondary | ICD-10-CM | POA: Insufficient documentation

## 2023-08-18 NOTE — Progress Notes (Signed)
 MRN : [id number]  Jennifer Lozano is a 58 y.o. (Apr 19, 1965) female who presents with chief complaint of check carotid arteries.  History of Present Illness:   The patient is seen for evaluation of a syncopal episode. The patient describes it as a light headedness that progresses to the point that she passes out.  This has happened on multiple occasions.  She has had to have the squad called.  She notes that it is always associated with a flare of her irritable bowel syndrome.  She develops a very intense very sharp very severe pain.  As the pain progresses she then begins to pass out.  There is no recent history of TIA symptoms or focal motor deficits. There is no prior documented CVA.  The patient was not taking enteric-coated aspirin 81 mg daily at the time.  There is no history of migraine headaches or prior diagnosis of ocular migraine. There is no history of seizures.  No recent shortening of the patient's walking distance or new symptoms consistent with claudication.  No history of rest pain symptoms. No new ulcers or wounds of the lower extremities have occurred.  Duplex ultrasound of the carotid arteries obtained today is normal bilaterally with normal vertebral flow.  There is no history of DVT, PE or superficial thrombophlebitis. No recent episodes of angina or shortness of breath documented.   No outpatient medications have been marked as taking for the 08/19/23 encounter (Appointment) with Jama, Cordella MATSU, MD.    Past Medical History:  Diagnosis Date   Asthma    COVID-19 07/15/2019   diagnosed in outpatient clinic on 07/15/2019   Diabetes mellitus without complication (HCC)    History of hiatal hernia    Hypertension    PONV (postoperative nausea and vomiting)     Past Surgical History:  Procedure Laterality Date   ABDOMINAL HYSTERECTOMY     CARPAL TUNNEL RELEASE Right 10/08/2018   Procedure: CARPAL TUNNEL RELEASE ENDOSCOPIC;  Surgeon:  Edie Norleen PARAS, MD;  Location: [facility] ORS;  Service: Orthopedics;  Laterality: Right;   CARPAL TUNNEL RELEASE Left 11/05/2018   Procedure: CARPAL TUNNEL RELEASE ENDOSCOPIC;  Surgeon: Edie Norleen PARAS, MD;  Location: [facility] ORS;  Service: Orthopedics;  Laterality: Left;   CATARACT EXTRACTION, BILATERAL     CHOLECYSTECTOMY     COLONOSCOPY WITH PROPOFOL  N/A 04/15/2017   Procedure: COLONOSCOPY WITH PROPOFOL ;  Surgeon: Unk Corinn Skiff, MD;  Location: [facility];  Service: Gastroenterology;  Laterality: N/A;   DORSAL COMPARTMENT RELEASE Right 10/08/2018   Procedure: DE QUERVAIN'S TENOSYNOVITIS RELEASE;  Surgeon: Edie Norleen PARAS, MD;  Location: [facility] ORS;  Service: Orthopedics;  Laterality: Right;   TONSILLECTOMY      Social History Social History   Tobacco Use   Smoking status: Never   Smokeless tobacco: Never  Vaping Use   Vaping status: Never Used  Substance Use Topics   Alcohol use: No   Drug use: No    Family History Family History  Problem Relation Age of Onset   Diabetes Sister    Diabetes Brother     Allergies  Allergen Reactions   Morphine And Codeine Palpitations     REVIEW OF SYSTEMS (Negative unless checked)  Constitutional: [] Weight loss  [] Fever  [] Chills Cardiac: [] Chest pain   [] Chest pressure   [] Palpitations   [] Shortness of breath when laying flat   [] Shortness  of breath with exertion. Vascular:  [x] Pain in legs with walking   [] Pain in legs at rest  [] History of DVT   [] Phlebitis   [] Swelling in legs   [] Varicose veins   [] Non-healing ulcers Pulmonary:   [] Uses home oxygen   [] Productive cough   [] Hemoptysis   [] Wheeze  [] COPD   [] Asthma Neurologic:  [] Dizziness   [] Seizures   [] History of stroke   [] History of TIA  [] Aphasia   [] Vissual changes   [] Weakness or numbness in arm   [] Weakness or numbness in leg Musculoskeletal:   [] Joint swelling   [] Joint pain   [] Low back pain Hematologic:  [] Easy bruising  [] Easy bleeding   [] Hypercoagulable state    [] Anemic Gastrointestinal:  [] Diarrhea   [] Vomiting  [] Gastroesophageal reflux/heartburn   [] Difficulty swallowing. Genitourinary:  [] Chronic kidney disease   [] Difficult urination  [] Frequent urination   [] Blood in urine Skin:  [] Rashes   [] Ulcers  Psychological:  [] History of anxiety   []  History of major depression.  Physical Examination  There were no vitals filed for this visit. There is no height or weight on file to calculate BMI. Gen: WD/WN, NAD Head: [place]/AT, No temporalis wasting.  Ear/Nose/Throat: Hearing grossly intact, nares w/o erythema or drainage Eyes: PER, EOMI, sclera nonicteric.  Neck: Supple, no masses.  No bruit or JVD.  Pulmonary:  Good air movement, no audible wheezing, no use of accessory muscles.  Cardiac: RRR, normal S1, S2, no Murmurs. Vascular:  no carotid bruit noted Vessel Right Left  Radial Palpable Palpable  Carotid  Palpable  Palpable  Subclav  Palpable Palpable  Gastrointestinal: soft, non-distended. No guarding/no peritoneal signs.  Musculoskeletal: M/S 5/5 throughout.  No visible deformity.  Neurologic: CN 2-12 intact. Pain and light touch intact in extremities.  Symmetrical.  Speech is fluent. Motor exam as listed above. Psychiatric: Judgment intact, Mood & affect appropriate for pt's clinical situation. Dermatologic: No rashes or ulcers noted.  No changes consistent with cellulitis.   CBC Lab Results  Component Value Date   WBC 12.5 (H) 07/20/2023   HGB 13.9 07/20/2023   HCT 41.5 07/20/2023   MCV 90.2 07/20/2023   PLT 262 07/20/2023    BMET    Component Value Date/Time   NA 139 07/20/2023 1427   NA 137 01/11/2014 1359   K 4.1 07/20/2023 1427   K 4.3 01/11/2014 1359   CL 104 07/20/2023 1427   CL 104 01/11/2014 1359   CO2 27 07/20/2023 1427   CO2 26 01/11/2014 1359   GLUCOSE 173 (H) 07/20/2023 1427   GLUCOSE 338 (H) 01/11/2014 1359   BUN 28 (H) 07/20/2023 1427   BUN 12 01/11/2014 1359   CREATININE 1.27 (H) 07/20/2023 1427    CREATININE 0.93 01/11/2014 1359   CALCIUM 9.0 07/20/2023 1427   CALCIUM 8.7 01/11/2014 1359   GFRNONAA 48 (L) 07/20/2023 1427   GFRNONAA >60 01/11/2014 1359   GFRNONAA 50 (L) 06/03/2013 2242   GFRAA 54 (L) 07/24/2019 1923   GFRAA >60 01/11/2014 1359   GFRAA 58 (L) 06/03/2013 2242   CrCl cannot be calculated (Patient's most recent lab result is older than the maximum 21 days allowed.).  COAG Lab Results  Component Value Date   INR 0.9 01/25/2013    Radiology No results found.   Assessment/Plan 1. Syncope, unspecified syncope type (Primary) Recommend:  Given the patient's asymptomatic minimal stenosis no further invasive testing or surgery at this time.  Given the association with the severe and intense abdominal pain  this appears to be a vasovagal response.  I did mention that a cardiology evaluation may be helpful as well as optimizing the treatment for her inflammatory bowel syndrome.  Duplex ultrasound shows an essentially normal study bilaterally.  Continue antiplatelet therapy as prescribed Continue management of CAD, HTN and Hyperlipidemia Healthy heart diet,  encouraged exercise at least 4 times per week  Given the essentially normal bilateral carotid stenosis in association with the patient's comorbidities the patient will follow up PRN.  The patient is told that if symptoms of a TIA should occur then he should go to the ER and I should be notified, as this would change the management course.  The patient/family voices understanding.  2. Bilateral carotid artery stenosis Recommend:  Given the patient's asymptomatic minimal stenosis no further invasive testing or surgery at this time.  Given the association with the severe and intense abdominal pain this appears to be a vasovagal response.  I did mention that a cardiology evaluation may be helpful as well as optimizing the treatment for her inflammatory bowel syndrome.  Duplex ultrasound shows an essentially normal  study bilaterally.  Continue antiplatelet therapy as prescribed Continue management of CAD, HTN and Hyperlipidemia Healthy heart diet,  encouraged exercise at least 4 times per week  Given the essentially normal bilateral carotid stenosis in association with the patient's comorbidities the patient will follow up PRN.  The patient is told that if symptoms of a TIA should occur then he should go to the ER and I should be notified, as this would change the management course.  The patient/family voices understanding.  3. PAD (peripheral artery disease) (HCC) Recommend:  I do not find evidence of life style limiting vascular disease. The patient specifically denies life style limitation.  Previous noninvasive studies including ABI's of the legs do not identify critical vascular problems.  The patient should continue walking and begin a more formal exercise program. The patient should continue his antiplatelet therapy and aggressive treatment of the lipid abnormalities.  The patient is instructed to call the office if there is a significant change in the lower extremity symptoms, particularly if a wound develops or there is an abrupt increase in leg pain.  Patient will follow-up with me on a PRN basis  4. Essential hypertension Continue antihypertensive medications as already ordered, these medications have been reviewed and there are no changes at this time.  5. Type 2 diabetes mellitus with other circulatory complication, with long-term current use of insulin (HCC) Continue hypoglycemic medications as already ordered, these medications have been reviewed and there are no changes at this time.  Hgb A1C to be monitored as already arranged by primary service    Cordella Shawl, MD  08/18/2023 3:26 PM

## 2023-08-19 ENCOUNTER — Ambulatory Visit (INDEPENDENT_AMBULATORY_CARE_PROVIDER_SITE_OTHER): Payer: Self-pay | Admitting: Vascular Surgery

## 2023-08-19 ENCOUNTER — Encounter (INDEPENDENT_AMBULATORY_CARE_PROVIDER_SITE_OTHER): Payer: Self-pay | Admitting: Vascular Surgery

## 2023-08-19 ENCOUNTER — Ambulatory Visit (INDEPENDENT_AMBULATORY_CARE_PROVIDER_SITE_OTHER)

## 2023-08-19 VITALS — BP 134/79 | HR 64 | Ht 62.0 in | Wt 226.8 lb

## 2023-08-19 DIAGNOSIS — I10 Essential (primary) hypertension: Secondary | ICD-10-CM

## 2023-08-19 DIAGNOSIS — Z794 Long term (current) use of insulin: Secondary | ICD-10-CM

## 2023-08-19 DIAGNOSIS — R55 Syncope and collapse: Secondary | ICD-10-CM | POA: Diagnosis not present

## 2023-08-19 DIAGNOSIS — I739 Peripheral vascular disease, unspecified: Secondary | ICD-10-CM | POA: Diagnosis not present

## 2023-08-19 DIAGNOSIS — I6523 Occlusion and stenosis of bilateral carotid arteries: Secondary | ICD-10-CM

## 2023-08-19 DIAGNOSIS — E1159 Type 2 diabetes mellitus with other circulatory complications: Secondary | ICD-10-CM

## 2023-08-23 ENCOUNTER — Telehealth: Payer: Self-pay

## 2023-08-23 NOTE — Telephone Encounter (Signed)
 The patient is scheduled to see Cadence Franchester, PA-C on 08/26/2023 as a new patient. Called and left a voice message to inquire about any previous cardiac history. Requested the patient to call back at their earliest convenience.

## 2023-08-26 ENCOUNTER — Ambulatory Visit: Attending: Medical | Admitting: Medical

## 2023-08-26 ENCOUNTER — Encounter: Payer: Self-pay | Admitting: Medical

## 2023-08-26 ENCOUNTER — Ambulatory Visit

## 2023-08-26 VITALS — BP 121/79 | HR 64 | Ht 62.5 in | Wt 229.0 lb

## 2023-08-26 DIAGNOSIS — Z79899 Other long term (current) drug therapy: Secondary | ICD-10-CM | POA: Diagnosis not present

## 2023-08-26 DIAGNOSIS — I10 Essential (primary) hypertension: Secondary | ICD-10-CM

## 2023-08-26 DIAGNOSIS — R55 Syncope and collapse: Secondary | ICD-10-CM | POA: Diagnosis not present

## 2023-08-26 DIAGNOSIS — E785 Hyperlipidemia, unspecified: Secondary | ICD-10-CM | POA: Diagnosis not present

## 2023-08-26 NOTE — Progress Notes (Signed)
 Cardiology Office Note   Date:  08/26/2023  ID:  Jennifer Lozano, DOB 1965-01-11, MRN [id number] PCP: Jacques Garre, NP  [facility] Providers Cardiologist:  New  History of Present Illness Jennifer Lozano is a 58 y.o. female with a history of diabetes, asthma, HLD, and hypertension who presents as a new patient for syncope.  Patient was seen in the ER 07/20/2023 with syncope.  Patient had vomiting, nausea, diarrhea for 3 days.  She went to an urgent care for flank pain and was told she had a kidney stone that she subsequently passed.  She denied fever or chills.  She reports she passed out 3 times at home.  Vitals were stable.  Labs showed serum creatinine 1.27, BUN 28, WBC 12.5.  EKG showed normal sinus rhythm, 87 bpm.  She was given IV fluids, antinausea medication, PPI.  She was discharged with Pepcid  and Zofran .  Carotid ultrasound showed no significant disease.  The patient reports passing out when using the bathroom. She has a long history of stomach issues with severe cramps causing severe diarrhea-she was told she has IBS by GI. When she is on the bathroom she passes out, can be three times a day; it can occur once a month or every 6 months. She feels dizzy and lightheaded when it occurs. she wakes up right away. She denies chest pain, SOB, palpitations prior to passing out. She eats and drinks normally. No orthostatic symptoms. Nothing makes it worse. No serious injuries from passing out.   Mother died of CHF. No smoking, alcohol, drug history. She is on disability for severe neuropathy. Lifestyle is more sedentary. Diet is good. She has been on Trulicity and lost 60-70lbs.    Studies Reviewed EKG Interpretation Date/Time:  Monday August 26 2023 08:37:21 EDT Ventricular Rate:  64 PR Interval:  158 QRS Duration:  70 QT Interval:  406 QTC Calculation: 418 R Axis:   0  Text Interpretation: Normal sinus rhythm Normal ECG When compared with ECG of 20-Jul-2023 14:26, No  significant change was found Confirmed by Franchester, Kecia Swoboda ([id number]) on 08/26/2023 8:46:05 AM    Carotid US  08/2023 Summary:  Right Carotid: There was no evidence of thrombus, dissection,  atherosclerotic                plaque or stenosis in the cervical carotid system.   Left Carotid: The extracranial vessels were near-normal with only minimal  wall               thickening or plaque.   Vertebrals:  Bilateral vertebral arteries demonstrate antegrade flow.  Subclavians: Normal flow hemodynamics were seen in bilateral subclavian               arteries.   Physical Exam VS:  BP 121/79 (BP Location: Right Arm, Patient Position: Sitting, Cuff Size: Normal)   Pulse 64   Ht 5' 2.5 (1.588 m)   Wt 229 lb (103.9 kg)   SpO2 100%   BMI 41.22 kg/m   Orthostatic VS for the past 24 hrs (Last 3 readings):  BP- Lying Pulse- Lying BP- Sitting Pulse- Sitting BP- Standing at 0 minutes Pulse- Standing at 0 minutes BP- Standing at 3 minutes Pulse- Standing at 3 minutes  08/26/23 0842 105/72 65 118/76 72 105/69 77 118/75 74      Wt Readings from Last 3 Encounters:  08/26/23 229 lb (103.9 kg)  08/19/23 226 lb 12.8 oz (102.9 kg)  07/20/23 222 lb (100.7 kg)    GEN:  Well nourished, well developed in no acute distress NECK: No JVD; No carotid bruits CARDIAC: RRR, no murmurs, rubs, gallops RESPIRATORY:  Clear to auscultation without rales, wheezing or rhonchi  ABDOMEN: Soft, non-tender, non-distended EXTREMITIES:  No edema; No deformity   ASSESSMENT AND PLAN  Syncope Patient reports chronic syncope related to GI issues.  She reports GI issues for the last 3 years with intermittent syncope while using the bathroom.  She does not have syncope otherwise.  She may have 3 episodes in 1 sitting or it may occur every 6 months.  She has seen GI who was told she has IBS.  Before passing out patient feels dizzy and lightheaded.  She denies palpitations, heart racing, chest pain, shortness of breath.  Patient comes  to immediately.  She has no serious injuries from passing out.  Patient stays well-hydrated and eats normally.  Lifestyle is overall sedentary due to disability (severe neuropathy).  Orthostatics today are negative, although there is a drop from sitting to standing.  Patient takes 2 medications for blood pressure.  Recent carotid ultrasound showed no significant disease.  I will check TSH, mag, CBC, BMET.  I will check a 30-day heart monitor and an echocardiogram.  Loop recorder has been discussed as last resort if symptoms are persistent. Symptoms overall sound vasovagal in nature.  HLD LDL 80 in 2024. Continue Lipitor 40mg  daily.   HTN Blood pressure is well-controlled.  Continue amlodipine 5 mg daily and Zestoretic 20-25 mg daily.       Dispo: Follow-up in 8 weeks  Signed, Ethanael Veith VEAR Fishman, PA-C

## 2023-08-26 NOTE — Patient Instructions (Signed)
 Medication Instructions:  Your physician recommends that you continue on your current medications as directed. Please refer to the Current Medication list given to you today.   *If you need a refill on your cardiac medications before your next appointment, please call your pharmacy*  Lab Work: Your provider would like for you to have following labs drawn today BMeT, CBC, TSH, and Mag level.   If you have labs (blood work) drawn today and your tests are completely normal, you will receive your results only by: MyChart Message (if you have MyChart) OR A paper copy in the mail If you have any lab test that is abnormal or we need to change your treatment, we will call you to review the results.  Testing/Procedures: Your physician has requested that you have an echocardiogram. Echocardiography is a painless test that uses sound waves to create images of your heart. It provides your doctor with information about the size and shape of your heart and how well your heart's chambers and valves are working.   You may receive an ultrasound enhancing agent through an IV if needed to better visualize your heart during the echo. This procedure takes approximately one hour.  There are no restrictions for this procedure.  This will take place at [address] [facility] [address], [place] [address]  Please note: We ask at that you not bring children with you during ultrasound (echo/ vascular) testing. Due to room size and safety concerns, children are not allowed in the ultrasound rooms during exams. Our front office staff cannot provide observation of children in our lobby area while testing is being conducted. An adult accompanying a patient to their appointment will only be allowed in the ultrasound room at the discretion of the ultrasound technician under special circumstances. We apologize for any inconvenience.    Your physician has recommended that you wear a Zio monitor 2 x 2 week  monitors.   This monitor is a medical device that records the heart's electrical activity. Doctors most often use these monitors to diagnose arrhythmias. Arrhythmias are problems with the speed or rhythm of the heartbeat. The monitor is a small device applied to your chest. You can wear one while you do your normal daily activities. While wearing this monitor if you have any symptoms to push the button and record what you felt. Once you have worn this monitor for the period of time provider prescribed (Usually 14 days), you will return the monitor device in the postage paid box. Once it is returned they will download the data collected and provide us  with a report which the provider will then review and we will call you with those results. Important tips:  Avoid showering during the first 24 hours of wearing the monitor. Avoid excessive sweating to help maximize wear time. Do not submerge the device, no hot tubs, and no swimming pools. Keep any lotions or oils away from the patch. After 24 hours you may shower with the patch on. Take brief showers with your back facing the shower head.  Do not remove patch once it has been placed because that will interrupt data and decrease adhesive wear time. Push the button when you have any symptoms and write down what you were feeling. Once you have completed wearing your monitor, remove and place into box which has postage paid and place in your outgoing mailbox.  If for some reason you have misplaced your box then call our office and we can provide another  box and/or mail it off for you.   Follow-Up: At [facility], you and your health needs are our priority.  As part of our continuing mission to provide you with exceptional heart care, our providers are all part of one team.  This team includes your primary Cardiologist (physician) and Advanced Practice Providers or APPs (Physician Assistants and Nurse Practitioners) who all work together to provide  you with the care you need, when you need it.  Your next appointment:   8 week(s) (about 1 week after your echo)  Provider:   You may see Cadence Franchester, PA-C

## 2023-08-27 LAB — BASIC METABOLIC PANEL WITH GFR
BUN/Creatinine Ratio: 28 (ref 12–28)
BUN: 37 mg/dL — ABNORMAL HIGH (ref 8–27)
CO2: 22 mmol/L (ref 20–29)
Calcium: 9.6 mg/dL (ref 8.7–10.3)
Chloride: 102 mmol/L (ref 96–106)
Creatinine, Ser: 1.32 mg/dL — ABNORMAL HIGH (ref 0.57–1.00)
Glucose: 140 mg/dL — ABNORMAL HIGH (ref 70–99)
Potassium: 4.3 mmol/L (ref 3.5–5.2)
Sodium: 138 mmol/L (ref 134–144)
eGFR: 46 mL/min/1.73 — ABNORMAL LOW (ref >59)

## 2023-08-27 LAB — CBC
Hematocrit: 41.5 % (ref 34.0–46.6)
Hemoglobin: 14.3 g/dL (ref 11.1–15.9)
MCH: 32.4 pg (ref 26.6–33.0)
MCHC: 34.5 g/dL (ref 31.5–35.7)
MCV: 94 fL (ref 79–97)
Platelets: 280 x10E3/uL (ref 150–450)
RBC: 4.42 x10E6/uL (ref 3.77–5.28)
RDW: 13.1 % (ref 11.7–15.4)
WBC: 8.8 x10E3/uL (ref 3.4–10.8)

## 2023-08-27 LAB — TSH: TSH: 0.133 u[IU]/mL — ABNORMAL LOW (ref 0.450–4.500)

## 2023-08-27 LAB — MAGNESIUM: Magnesium: 2.1 mg/dL (ref 1.6–2.3)

## 2023-08-28 ENCOUNTER — Ambulatory Visit: Payer: Self-pay | Admitting: Medical

## 2023-08-28 DIAGNOSIS — Z79899 Other long term (current) drug therapy: Secondary | ICD-10-CM

## 2023-08-28 DIAGNOSIS — R9431 Abnormal electrocardiogram [ECG] [EKG]: Secondary | ICD-10-CM

## 2023-08-28 DIAGNOSIS — I471 Supraventricular tachycardia, unspecified: Secondary | ICD-10-CM

## 2023-08-31 LAB — T3, FREE: T3, Free: 2.9 pg/mL (ref 2.0–4.4)

## 2023-08-31 LAB — T4, FREE: Free T4: 1.57 ng/dL (ref 0.82–1.77)

## 2023-09-20 DIAGNOSIS — R55 Syncope and collapse: Secondary | ICD-10-CM | POA: Diagnosis not present

## 2023-09-24 MED ORDER — METOPROLOL TARTRATE 50 MG PO TABS
ORAL_TABLET | ORAL | 0 refills | Status: DC
Start: 2023-09-24 — End: 2023-09-30

## 2023-09-24 MED ORDER — METOPROLOL SUCCINATE ER 25 MG PO TB24: 12.5000 mg | ORAL_TABLET | Freq: Every day | ORAL | 3 refills | Status: AC

## 2023-09-27 ENCOUNTER — Telehealth: Payer: Self-pay

## 2023-09-27 ENCOUNTER — Ambulatory Visit (HOSPITAL_BASED_OUTPATIENT_CLINIC_OR_DEPARTMENT_OTHER)
Admission: RE | Admit: 2023-09-27 | Discharge: 2023-09-27 | Disposition: A | Discharge status: Home / Self Care | Source: Ambulatory Visit | Attending: Internal Medicine | Admitting: Internal Medicine

## 2023-09-27 ENCOUNTER — Ambulatory Visit (HOSPITAL_COMMUNITY)
Admission: RE | Admit: 2023-09-27 | Discharge: 2023-09-27 | Disposition: A | Discharge status: Home / Self Care | Source: Ambulatory Visit | Attending: Medical | Admitting: Medical

## 2023-09-27 ENCOUNTER — Telehealth: Payer: Self-pay | Admitting: Medical

## 2023-09-27 ENCOUNTER — Ambulatory Visit: Payer: Self-pay | Admitting: Medical

## 2023-09-27 ENCOUNTER — Inpatient Hospital Stay
Admission: EM | Admit: 2023-09-27 | Discharge: 2023-09-30 | DRG: 322 | Disposition: A | Discharge status: Home / Self Care | Attending: Student in an Organized Health Care Education/Training Program | Admitting: Student in an Organized Health Care Education/Training Program

## 2023-09-27 ENCOUNTER — Emergency Department

## 2023-09-27 ENCOUNTER — Other Ambulatory Visit: Payer: Self-pay

## 2023-09-27 ENCOUNTER — Other Ambulatory Visit: Payer: Self-pay | Admitting: Internal Medicine

## 2023-09-27 DIAGNOSIS — I25118 Atherosclerotic heart disease of native coronary artery with other forms of angina pectoris: Secondary | ICD-10-CM | POA: Diagnosis not present

## 2023-09-27 DIAGNOSIS — E1165 Type 2 diabetes mellitus with hyperglycemia: Secondary | ICD-10-CM | POA: Diagnosis present

## 2023-09-27 DIAGNOSIS — E119 Type 2 diabetes mellitus without complications: Secondary | ICD-10-CM

## 2023-09-27 DIAGNOSIS — Z79890 Hormone replacement therapy (postmenopausal): Secondary | ICD-10-CM | POA: Diagnosis not present

## 2023-09-27 DIAGNOSIS — E782 Mixed hyperlipidemia: Secondary | ICD-10-CM | POA: Diagnosis not present

## 2023-09-27 DIAGNOSIS — Z794 Long term (current) use of insulin: Secondary | ICD-10-CM

## 2023-09-27 DIAGNOSIS — I440 Atrioventricular block, first degree: Secondary | ICD-10-CM | POA: Diagnosis present

## 2023-09-27 DIAGNOSIS — R931 Abnormal findings on diagnostic imaging of heart and coronary circulation: Secondary | ICD-10-CM | POA: Insufficient documentation

## 2023-09-27 DIAGNOSIS — I472 Ventricular tachycardia, unspecified: Secondary | ICD-10-CM | POA: Diagnosis present

## 2023-09-27 DIAGNOSIS — E785 Hyperlipidemia, unspecified: Secondary | ICD-10-CM | POA: Diagnosis present

## 2023-09-27 DIAGNOSIS — R079 Chest pain, unspecified: Secondary | ICD-10-CM | POA: Diagnosis present

## 2023-09-27 DIAGNOSIS — I471 Supraventricular tachycardia, unspecified: Secondary | ICD-10-CM | POA: Diagnosis present

## 2023-09-27 DIAGNOSIS — Z7982 Long term (current) use of aspirin: Secondary | ICD-10-CM | POA: Diagnosis not present

## 2023-09-27 DIAGNOSIS — Z90710 Acquired absence of both cervix and uterus: Secondary | ICD-10-CM | POA: Diagnosis not present

## 2023-09-27 DIAGNOSIS — Z8616 Personal history of COVID-19: Secondary | ICD-10-CM

## 2023-09-27 DIAGNOSIS — Z7985 Long-term (current) use of injectable non-insulin antidiabetic drugs: Secondary | ICD-10-CM

## 2023-09-27 DIAGNOSIS — R55 Syncope and collapse: Secondary | ICD-10-CM | POA: Diagnosis present

## 2023-09-27 DIAGNOSIS — Z1152 Encounter for screening for COVID-19: Secondary | ICD-10-CM | POA: Diagnosis not present

## 2023-09-27 DIAGNOSIS — Z79899 Other long term (current) drug therapy: Secondary | ICD-10-CM

## 2023-09-27 DIAGNOSIS — E039 Hypothyroidism, unspecified: Secondary | ICD-10-CM | POA: Diagnosis present

## 2023-09-27 DIAGNOSIS — E1142 Type 2 diabetes mellitus with diabetic polyneuropathy: Secondary | ICD-10-CM | POA: Diagnosis present

## 2023-09-27 DIAGNOSIS — I25110 Atherosclerotic heart disease of native coronary artery with unstable angina pectoris: Secondary | ICD-10-CM | POA: Diagnosis present

## 2023-09-27 DIAGNOSIS — I200 Unstable angina: Principal | ICD-10-CM | POA: Diagnosis present

## 2023-09-27 DIAGNOSIS — Z8249 Family history of ischemic heart disease and other diseases of the circulatory system: Secondary | ICD-10-CM

## 2023-09-27 DIAGNOSIS — Z833 Family history of diabetes mellitus: Secondary | ICD-10-CM

## 2023-09-27 DIAGNOSIS — J45909 Unspecified asthma, uncomplicated: Secondary | ICD-10-CM | POA: Diagnosis present

## 2023-09-27 DIAGNOSIS — I10 Essential (primary) hypertension: Secondary | ICD-10-CM | POA: Diagnosis present

## 2023-09-27 DIAGNOSIS — I2510 Atherosclerotic heart disease of native coronary artery without angina pectoris: Secondary | ICD-10-CM | POA: Diagnosis not present

## 2023-09-27 DIAGNOSIS — R5381 Other malaise: Secondary | ICD-10-CM | POA: Diagnosis present

## 2023-09-27 DIAGNOSIS — Z885 Allergy status to narcotic agent status: Secondary | ICD-10-CM

## 2023-09-27 DIAGNOSIS — R9431 Abnormal electrocardiogram [ECG] [EKG]: Secondary | ICD-10-CM

## 2023-09-27 LAB — CBC
HCT: 39.7 % (ref 36.0–46.0)
Hemoglobin: 13.4 g/dL (ref 12.0–15.0)
MCH: 30 pg (ref 26.0–34.0)
MCHC: 33.8 g/dL (ref 30.0–36.0)
MCV: 88.8 fL (ref 80.0–100.0)
Platelets: 260 K/uL (ref 150–400)
RBC: 4.47 MIL/uL (ref 3.87–5.11)
RDW: 12.8 % (ref 11.5–15.5)
WBC: 8 K/uL (ref 4.0–10.5)
nRBC: 0 % (ref 0.0–0.2)

## 2023-09-27 LAB — BASIC METABOLIC PANEL WITH GFR
Anion gap: 11 (ref 5–15)
BUN: 25 mg/dL — ABNORMAL HIGH (ref 6–20)
CO2: 27 mmol/L (ref 22–32)
Calcium: 8.7 mg/dL — ABNORMAL LOW (ref 8.9–10.3)
Chloride: 101 mmol/L (ref 98–111)
Creatinine, Ser: 1.4 mg/dL — ABNORMAL HIGH (ref 0.44–1.00)
GFR, Estimated: 43 mL/min — ABNORMAL LOW (ref >60)
Glucose, Bld: 166 mg/dL — ABNORMAL HIGH (ref 70–99)
Potassium: 3.7 mmol/L (ref 3.5–5.1)
Sodium: 139 mmol/L (ref 135–145)

## 2023-09-27 LAB — APTT: aPTT: 32 s (ref 24–36)

## 2023-09-27 LAB — PROTIME-INR
INR: 1 (ref 0.8–1.2)
Prothrombin Time: 13.6 s (ref 11.4–15.2)

## 2023-09-27 LAB — CBG MONITORING, ED: Glucose-Capillary: 182 mg/dL — ABNORMAL HIGH (ref 70–99)

## 2023-09-27 LAB — TROPONIN I (HIGH SENSITIVITY)
Troponin I (High Sensitivity): 4 ng/L (ref <18)
Troponin I (High Sensitivity): 4 ng/L (ref <18)
Troponin I (High Sensitivity): 5 ng/L (ref <18)

## 2023-09-27 MED ORDER — NITROGLYCERIN 0.4 MG SL SUBL
0.8000 mg | SUBLINGUAL_TABLET | Freq: Once | SUBLINGUAL | Status: AC
  Administered 2023-09-27: 0.8 mg via SUBLINGUAL

## 2023-09-27 MED ORDER — ASPIRIN 81 MG PO CHEW
324.0000 mg | CHEWABLE_TABLET | Freq: Once | ORAL | Status: AC
  Administered 2023-09-27: 324 mg via ORAL
  Filled 2023-09-27: qty 4

## 2023-09-27 MED ORDER — METOPROLOL SUCCINATE ER 25 MG PO TB24
12.5000 mg | ORAL_TABLET | Freq: Every day | ORAL | Status: DC
  Administered 2023-09-27 – 2023-09-30 (×4): 12.5 mg via ORAL
  Filled 2023-09-27 (×4): qty 1

## 2023-09-27 MED ORDER — INSULIN ASPART 100 UNIT/ML IJ SOLN
0.0000 [IU] | Freq: Every day | INTRAMUSCULAR | Status: DC
  Administered 2023-09-28: 2 [IU] via SUBCUTANEOUS
  Filled 2023-09-27: qty 1

## 2023-09-27 MED ORDER — ONDANSETRON HCL 4 MG/2ML IJ SOLN: 4.0000 mg | Freq: Four times a day (QID) | INTRAMUSCULAR | Status: DC | PRN

## 2023-09-27 MED ORDER — HEPARIN BOLUS VIA INFUSION
4000.0000 [IU] | Freq: Once | INTRAVENOUS | Status: AC
  Administered 2023-09-27: 4000 [IU] via INTRAVENOUS
  Filled 2023-09-27: qty 4000

## 2023-09-27 MED ORDER — PANTOPRAZOLE SODIUM 40 MG PO TBEC
40.0000 mg | DELAYED_RELEASE_TABLET | Freq: Every day | ORAL | Status: DC
  Administered 2023-09-27 – 2023-09-30 (×4): 40 mg via ORAL
  Filled 2023-09-27 (×4): qty 1

## 2023-09-27 MED ORDER — SODIUM CHLORIDE 0.9 % IV SOLN: INTRAVENOUS | Status: DC

## 2023-09-27 MED ORDER — IOHEXOL 350 MG/ML SOLN
100.0000 mL | Freq: Once | INTRAVENOUS | Status: AC | PRN
  Administered 2023-09-27: 100 mL via INTRAVENOUS

## 2023-09-27 MED ORDER — INSULIN ASPART 100 UNIT/ML IJ SOLN
0.0000 [IU] | Freq: Three times a day (TID) | INTRAMUSCULAR | Status: DC
  Administered 2023-09-28: 4 [IU] via SUBCUTANEOUS
  Administered 2023-09-28: 3 [IU] via SUBCUTANEOUS
  Administered 2023-09-28 – 2023-09-29 (×3): 4 [IU] via SUBCUTANEOUS
  Administered 2023-09-29 – 2023-09-30 (×2): 7 [IU] via SUBCUTANEOUS
  Administered 2023-09-30: 4 [IU] via SUBCUTANEOUS
  Filled 2023-09-27 (×3): qty 1
  Filled 2023-09-27 (×2): qty 4
  Filled 2023-09-27 (×3): qty 1

## 2023-09-27 MED ORDER — HEPARIN (PORCINE) 25000 UT/250ML-% IV SOLN
1200.0000 [IU]/h | INTRAVENOUS | Status: DC
  Administered 2023-09-27 – 2023-09-29 (×4): 1050 [IU]/h via INTRAVENOUS
  Filled 2023-09-27 (×3): qty 250

## 2023-09-27 MED ORDER — ACETAMINOPHEN 325 MG PO TABS
650.0000 mg | ORAL_TABLET | ORAL | Status: DC | PRN
  Administered 2023-09-28 – 2023-09-29 (×2): 650 mg via ORAL
  Filled 2023-09-27 (×2): qty 2

## 2023-09-27 NOTE — ED Triage Notes (Signed)
 Patient states she had a CT chest today and since having procedure she has had chest pain.

## 2023-09-27 NOTE — ED Notes (Signed)
 Pt comfortable and resting, family in room, no needs at this time

## 2023-09-27 NOTE — ED Provider Notes (Signed)
 [facility] Provider Note    Event Date/Time   First MD Initiated Contact with Patient 09/27/23 1739     (approximate)   History   Chest Pain   HPI  Jennifer Lozano is a 58 y.o. female who comes in with today with abnormal cardiac CT that was recommending catheterization.  Patient has been having active chest pain.  Patient reports of chest pain that feels just like a pain along her chest.  She denies any shortness of breath.  She does report taking medications for high blood pressure.  She denies any active pain at this time.  No abdominal pain.     Physical Exam   Triage Vital Signs: ED Triage Vitals  Encounter Vitals Group     BP 09/27/23 1733 (!) 157/64     Girls Systolic BP Percentile --      Girls Diastolic BP Percentile --      Boys Systolic BP Percentile --      Boys Diastolic BP Percentile --      Pulse Rate 09/27/23 1733 65     Resp 09/27/23 1733 18     Temp 09/27/23 1733 97.8 F (36.6 C)     Temp Source 09/27/23 1733 Oral     SpO2 09/27/23 1733 100 %     Weight 09/27/23 1732 217 lb (98.4 kg)     Height 09/27/23 1732 5' 2 (1.575 m)     Head Circumference --      Peak Flow --      Pain Score 09/27/23 1731 8     Pain Loc --      Pain Education --      Exclude from Growth Chart --     Most recent vital signs: Vitals:   09/27/23 1733  BP: (!) 157/64  Pulse: 65  Resp: 18  Temp: 97.8 F (36.6 C)  SpO2: 100%     General: Awake, no distress.  CV:  Good peripheral perfusion.  No murmur Resp:  Normal effort.  Clear lungs Abd:  No distention.  Soft and nontender Other:  Equal radial pulses equal DP pulses sensation intact.   ED Results / Procedures / Treatments   Labs (all labs ordered are listed, but only abnormal results are displayed) Labs Reviewed  BASIC METABOLIC PANEL WITH GFR - Abnormal; Notable for the following components:      Result Value   Glucose, Bld 166 (*)    BUN 25 (*)    Creatinine, Ser 1.40 (*)     Calcium 8.7 (*)    GFR, Estimated 43 (*)    All other components within normal limits  CBC  APTT  PROTIME-INR  CBC  HEMOGLOBIN A1C  HIV ANTIBODY (ROUTINE TESTING W REFLEX)  LIPID PANEL  HEPARIN  LEVEL (UNFRACTIONATED)  TROPONIN I (HIGH SENSITIVITY)  TROPONIN I (HIGH SENSITIVITY)     EKG  My interpretation of EKG:  Normal sinus rate 67 any ST elevation or T wave version, normal intervals  RADIOLOGY I have reviewed the xray personally and interpreted no evidence of any pneumonia   PROCEDURES:  Critical Care performed: Yes, see critical care procedure note(s)  .1-3 Lead EKG Interpretation  Performed by: Ernest Ronal BRAVO, MD Authorized by: Ernest Ronal BRAVO, MD     Interpretation: normal     ECG rate:  60   ECG rate assessment: normal     Rhythm: sinus rhythm     Ectopy: none     Conduction: normal   .  Critical Care  Performed by: Ernest Ronal BRAVO, MD Authorized by: Ernest Ronal BRAVO, MD   Critical care provider statement:    Critical care time (minutes):  30   Critical care was necessary to treat or prevent imminent or life-threatening deterioration of the following conditions:  Cardiac failure   Critical care was time spent personally by me on the following activities:  Development of treatment plan with patient or surrogate, discussions with consultants, evaluation of patient's response to treatment, examination of patient, ordering and review of laboratory studies, ordering and review of radiographic studies, ordering and performing treatments and interventions, pulse oximetry, re-evaluation of patient's condition and review of old charts    MEDICATIONS ORDERED IN ED: Medications  heparin  bolus via infusion 4,000 Units (has no administration in time range)  heparin  ADULT infusion 100 units/mL (25000 units/250mL) (has no administration in time range)  metoprolol  succinate (TOPROL -XL) 24 hr tablet 12.5 mg (has no administration in time range)  pantoprazole  (PROTONIX ) EC tablet  40 mg (has no administration in time range)  acetaminophen  (TYLENOL ) tablet 650 mg (has no administration in time range)  ondansetron  (ZOFRAN ) injection 4 mg (has no administration in time range)  insulin  aspart (novoLOG ) injection 0-20 Units (has no administration in time range)  insulin  aspart (novoLOG ) injection 0-5 Units (has no administration in time range)  0.9 %  sodium chloride  infusion (has no administration in time range)  aspirin  chewable tablet 324 mg (324 mg Oral Given 09/27/23 1851)     IMPRESSION / MDM / ASSESSMENT AND PLAN / ED COURSE  I reviewed the triage vital signs and the nursing notes.   Patient's presentation is most consistent with acute presentation with potential threat to life or bodily function.      INITIAL IMPRESSION / ASSESSMENT AND PLAN / ED COURSE   Most Likely DDx:  -Consider ACS vs MSK Vs Thalia- will get EKG/troponin to evaluate for ACS       DDx that was also considered d/t potential to cause harm, but was found less likely based on history and physical (as detailed above): -PNA (no fevers, cough but CXR to evaluate) -PNX (reassured with equal b/l breath sounds, CXR to evaluate) -Symptomatic anemia (will get H&H) -Pulmonary embolism as no sob at rest, not pleuritic in nature, no hypoxia -Aortic Dissection as no tearing pain and no radiation to the mid back, pulses equal, no murmur -Pericarditis no EKG changes or hx to suggest dx -Tamponade (no notable SOB, tachycardic, hypotensive) -Esophageal rupture (no h/o diffuse vomitting/no crepitus)   Given patient's positive CT scan outpatient concerning for coronary disease and the patient's story this is concerning for unstable angina.  Patient placed on heparin , given aspirin .  I did message cardiology so they were aware but likely patient's troponin here is negative.  CBC is reassuring.  BMP is reassuring with stable creatinine  Did page cardiology so they are aware of since he did not read my  message.  I did discuss with the hospital team for admission.   Note:  This document was prepared using Dragon voice recognition software and may include unintentional dictation errors.   The patient is on the cardiac monitor to evaluate for evidence of arrhythmia and/or significant heart rate changes.      FINAL CLINICAL IMPRESSION(S) / ED DIAGNOSES   Final diagnoses:  Unstable angina (HCC)     Rx / DC Orders   ED Discharge Orders     None  Note:  This document was prepared using Dragon voice recognition software and may include unintentional dictation errors.   Ernest Ronal BRAVO, MD 09/27/23 [phone]

## 2023-09-27 NOTE — ED Notes (Signed)
 Urine specimen sent to the lab in case we need it

## 2023-09-27 NOTE — Consult Note (Addendum)
 Pharmacy Consult Note - Anticoagulation  Pharmacy Consult for Heparin  infusion Indication: chest pain/ACS Allergies  Allergen Reactions   Morphine And Codeine Palpitations    PATIENT MEASUREMENTS: Height: 5' 2 (157.5 cm) Weight: 98.4 kg (217 lb) IBW/kg (Calculated) : 50.1 HEPARIN  DW (KG): 73.4  VITAL SIGNS: Temp: 97.8 F (36.6 C) (09/19 1733) Temp Source: Oral (09/19 1733) BP: 157/64 (09/19 1733) Pulse Rate: 65 (09/19 1733)  Recent Labs    09/27/23 1736  HGB 13.4  HCT 39.7  PLT 260  CREATININE 1.40*  TROPONINIHS 4    Estimated Creatinine Clearance: 46.8 mL/min (A) (by C-G formula based on SCr of 1.4 mg/dL (H)).  PAST MEDICAL HISTORY: Past Medical History:  Diagnosis Date   Asthma    COVID-19 07/15/2019   diagnosed in outpatient clinic on 07/15/2019   Diabetes mellitus without complication (HCC)    History of hiatal hernia    Hypertension    PONV (postoperative nausea and vomiting)     Medications:  (Not in a hospital admission)  Scheduled:   aspirin   324 mg Oral Once   heparin   4,000 Units Intravenous Once   Infusions:   heparin       ASSESSMENT: 58 y.o. female with PMH HTN, DM is presenting with chest pain and abnormal cardiac CT showing hemodynamically significant stenosis in the proximal LAD and proximal-mid RCA with a recommendation of cardiac catheterization. Patient is not on chronic anticoagulation per chart review. Pharmacy has been consulted to initiate and manage heparin  intravenous infusion.   Goal(s) of therapy: Heparin  level 0.3 - 0.7 units/mL aPTT 66 - 102 seconds Monitor platelets by anticoagulation protocol: Yes   Baseline anticoagulation labs: Recent Labs    09/27/23 1736  HGB 13.4  PLT 260   Baseline aPTT, INR pending   PLAN:  Give 4000 units bolus x1; then start heparin  infusion at 1050 units/hour.  Check heparin  level in 6 hours, then daily once at least two levels are consecutively therapeutic.  Monitor CBC daily while  on heparin  infusion.    Annabella LOISE Banks, PharmD Clinical Pharmacist 09/27/2023 6:47 PM

## 2023-09-27 NOTE — H&P (Signed)
 History and Physical    Patient: Jennifer Lozano FMW:[id number] DOB: 03/15/1965 DOA: 09/27/2023 DOS: the patient was seen and examined on 09/27/2023 PCP: Jacques Garre, NP  Patient coming from: Home  Chief Complaint:  Chief Complaint  Patient presents with   Chest Pain   HPI: Jennifer Lozano is a 58 y.o. female with medical history significant of type 2 diabetes, asthma, essential hypertension, hypothyroidism, hyperlipidemia, morbid obesity, who presented to the ER with complaint of chest pain.  Patient was apparently seen in the outpatient setting with chest pain.  Chest pain has been tight and persistent.  It lasted some hours but is gone now.  No fever or chills.  Some diaphoresis.  Patient had CT coronaries that showed significant narrowing.  At this point cardiology sent her over for cardiac cath.  Patient will likely have cardiac cath on Monday.  Review of Systems: As mentioned in the history of present illness. All other systems reviewed and are negative. Past Medical History:  Diagnosis Date   Asthma    COVID-19 07/15/2019   diagnosed in outpatient clinic on 07/15/2019   Diabetes mellitus without complication (HCC)    History of hiatal hernia    Hypertension    PONV (postoperative nausea and vomiting)    Past Surgical History:  Procedure Laterality Date   ABDOMINAL HYSTERECTOMY     CARPAL TUNNEL RELEASE Right 10/08/2018   Procedure: CARPAL TUNNEL RELEASE ENDOSCOPIC;  Surgeon: Edie Norleen PARAS, MD;  Location: [facility] ORS;  Service: Orthopedics;  Laterality: Right;   CARPAL TUNNEL RELEASE Left 11/05/2018   Procedure: CARPAL TUNNEL RELEASE ENDOSCOPIC;  Surgeon: Edie Norleen PARAS, MD;  Location: [facility] ORS;  Service: Orthopedics;  Laterality: Left;   CATARACT EXTRACTION, BILATERAL     CHOLECYSTECTOMY     COLONOSCOPY WITH PROPOFOL  N/A 04/15/2017   Procedure: COLONOSCOPY WITH PROPOFOL ;  Surgeon: Unk Corinn Skiff, MD;  Location: [facility] ENDOSCOPY;  Service: Gastroenterology;  Laterality: N/A;    DORSAL COMPARTMENT RELEASE Right 10/08/2018   Procedure: DE QUERVAIN'S TENOSYNOVITIS RELEASE;  Surgeon: Edie Norleen PARAS, MD;  Location: [facility] ORS;  Service: Orthopedics;  Laterality: Right;   TONSILLECTOMY     Social History:  reports that she has never smoked. She has never used smokeless tobacco. She reports that she does not drink alcohol and does not use drugs.  Allergies  Allergen Reactions   Morphine And Codeine Palpitations    Family History  Problem Relation Age of Onset   Heart disease Mother    Diabetes Mother    Heart Problems Mother    Diabetes Sister    Diabetes Brother     Prior to Admission medications   Medication Sig Start Date End Date Taking? Authorizing Provider  doxycycline (VIBRA-TABS) 100 MG tablet Take 100 mg by mouth 2 (two) times daily. 09/20/23  Yes [provider]  pantoprazole  (PROTONIX ) 40 MG tablet Take 40 mg by mouth daily. 09/17/23  Yes [provider]  pravastatin (PRAVACHOL) 40 MG tablet Take 40 mg by mouth daily. 09/16/23  Yes [provider]  pregabalin (LYRICA) 75 MG capsule Take 75 mg by mouth daily. 09/10/23  Yes [provider]  albuterol (VENTOLIN HFA) 108 (90 Base) MCG/ACT inhaler Inhale 2 puffs into the lungs. 12/20/21   [provider]  amLODipine (NORVASC) 5 MG tablet Take 5 mg by mouth daily.    [provider]  atorvastatin (LIPITOR) 40 MG tablet Take 40 mg by mouth daily. 11/16/19   [provider]  azithromycin  (ZITHROMAX  Z-PAK)  250 MG tablet Take 2 tablets (500 mg) on  Day 1,  followed by 1 tablet (250 mg) once daily on Days 2 through 5. Patient not taking: Reported on 08/26/2023 03/09/19   Angelena Smalls, MD  Continuous Glucose Sensor (DEXCOM G7 SENSOR) MISC 1 each. 08/17/22   [provider]  famotidine  (PEPCID ) 20 MG tablet Take 1 tablet (20 mg total) by mouth 2 (two) times daily. 07/20/23   Viviann Pastor, MD  HYDROcodone -acetaminophen  (NORCO) 5-325 MG tablet Take 1-2  tablets by mouth every 6 (six) hours as needed for moderate pain. MAXIMUM TOTAL ACETAMINOPHEN  DOSE IS 4000 MG PER DAY 11/05/18   Poggi, John J, MD  ibuprofen (ADVIL) 200 MG tablet Take 800 mg by mouth every 8 (eight) hours as needed for moderate pain.     [provider]  LANTUS SOLOSTAR 100 UNIT/ML Solostar Pen Inject 40 Units into the skin daily.    [provider]  levothyroxine (SYNTHROID, LEVOTHROID) 125 MCG tablet Take 125 mcg by mouth daily before breakfast.  04/25/17   [provider]  lisinopril-hydrochlorothiazide (PRINZIDE,ZESTORETIC) 20-25 MG tablet Take 1 tablet by mouth daily. 03/26/16   [provider]  metoprolol  succinate (TOPROL -XL) 25 MG 24 hr tablet Take 0.5 tablets (12.5 mg total) by mouth daily. Take with or immediately following a meal. 09/24/23   Furth, Cadence H, PA-C  metoprolol  tartrate (LOPRESSOR ) 50 MG tablet TAKE 1 TABLET 2 HR PRIOR TO CARDIAC PROCEDURE 09/24/23   Furth, Cadence H, PA-C  NOVOLOG  100 UNIT/ML injection Inject into the skin continuous. Adjust insulin  pump according to carb intake 03/15/16   [provider]  ondansetron  (ZOFRAN -ODT) 4 MG disintegrating tablet Take 1 tablet (4 mg total) by mouth every 8 (eight) hours as needed for nausea or vomiting. 07/20/23   Viviann Pastor, MD  predniSONE  (DELTASONE ) 10 MG tablet Take 4 tabs (40 mg) PO x 3 days, then take 2 tabs (20 mg) PO x 3 days, then take 1 tab (10 mg) PO x 3 days, then take 1/2 tab (5 mg) PO x 4 days. Patient not taking: Reported on 08/26/2023 07/25/19   Gordan Huxley, MD  TRULICITY 4.5 MG/0.5ML SOAJ Inject 4.5 mg into the skin once a week. 06/14/23   [provider]    Physical Exam: Vitals:   09/27/23 1732 09/27/23 1733  BP:  (!) 157/64  Pulse:  65  Resp:  18  Temp:  97.8 F (36.6 C)  TempSrc:  Oral  SpO2:  100%  Weight: 98.4 kg   Height: 5' 2 (1.575 m)    Constitutional: NAD, calm, comfortable Eyes: PERRL, lids and conjunctivae  normal ENMT: Mucous membranes are moist. Posterior pharynx clear of any exudate or lesions.Normal dentition.  Neck: normal, supple, no masses, no thyromegaly Respiratory: clear to auscultation bilaterally, no wheezing, no crackles. Normal respiratory effort. No accessory muscle use.  Cardiovascular: Regular rate and rhythm, no murmurs / rubs / gallops. No extremity edema. 2+ pedal pulses. No carotid bruits.  Abdomen: no tenderness, no masses palpated. No hepatosplenomegaly. Bowel sounds positive.  Musculoskeletal: Good range of motion, no joint swelling or tenderness, Skin: no rashes, lesions, ulcers. No induration Neurologic: CN 2-12 grossly intact. Sensation intact, DTR normal. Strength 5/5 in all 4.  Psychiatric: Normal judgment and insight. Alert and oriented x 3. Normal mood  Data Reviewed:  Blood pressure is 189/113, pulse 120 respiratory of 29, CBC within normal BUN 25 creatinine 1.40 calcium 8.7.  CT coronary shows severe coronary artery disease in  the proximal LAD, proximal mid RCA and proximal PDA with 70 to 99% stenosis.  Also coronary calcium score is 248 which is in the 95th percentile of age and sex matched control.  Chest x-ray showed no acute findings  Assessment and Plan:  #1 unstable angina: Patient has significant findings on CT coronary.  Cardiology has recommended admission for planned cardiac cath.  Will admit the patient to a monitored bed.  Initiate IV heparin .  Continue cardiac medications and care.  Cardiology to follow and plan cardiac catheter.  #2 essential hypertension: Continue blood pressure medications from home  #3 hyperlipidemia: Continue with statin  #4 diabetes: Non-insulin -dependent.  Initiate sliding scale insulin .  #5 morbid obesity: Dietary counseling    Advance Care Planning:   Code Status: Not on file full code  Consults: Dr. Cherrie, cardiology  Family Communication: Husband at bedside  Severity of Illness: The appropriate patient  status for this patient is INPATIENT. Inpatient status is judged to be reasonable and necessary in order to provide the required intensity of service to ensure the patient's safety. The patient's presenting symptoms, physical exam findings, and initial radiographic and laboratory data in the context of their chronic comorbidities is felt to place them at high risk for further clinical deterioration. Furthermore, it is not anticipated that the patient will be medically stable for discharge from the hospital within 2 midnights of admission.   * I certify that at the point of admission it is my clinical judgment that the patient will require inpatient hospital care spanning beyond 2 midnights from the point of admission due to high intensity of service, high risk for further deterioration and high frequency of surveillance required.*  AuthorBETHA SIM KNOLL, MD 09/27/2023 6:51 PM  For on call review [email or web address].

## 2023-09-27 NOTE — Telephone Encounter (Signed)
 Called patient regarding the message below. The patient reported currently experiencing chest pain and stated that a previous nurse instructed her to report to the ER. I informed the patient that I also recommend she go to the ER for further evaluation. The patient verbalized understanding.   Furth, Cadence H, PA-C  Desiderio Russell SAILOR, RN Cardiac CTA showed  severe proximal LAD and proximal Rca disease, FFR positive. This is by MD reader. She will need a cardiac cath next week. We can bring her into discuss early next week and set her up for cardiac cath.

## 2023-09-27 NOTE — Telephone Encounter (Signed)
 Patient had a CT Morph today and since going home she is feeling tired, has tiny pains in her chest which comes and goes and some nausea. Please Advise

## 2023-09-27 NOTE — ED Notes (Signed)
 Family sitting with pt ask for something to eat, provided a snack tray.

## 2023-09-27 NOTE — Telephone Encounter (Signed)
 Called patient to advise she go to the ED for evaluation of chest pain and nausea - patient verbalized understanding and agreement with plan

## 2023-09-28 DIAGNOSIS — I10 Essential (primary) hypertension: Secondary | ICD-10-CM | POA: Diagnosis not present

## 2023-09-28 DIAGNOSIS — I25118 Atherosclerotic heart disease of native coronary artery with other forms of angina pectoris: Secondary | ICD-10-CM | POA: Diagnosis not present

## 2023-09-28 DIAGNOSIS — E785 Hyperlipidemia, unspecified: Secondary | ICD-10-CM

## 2023-09-28 DIAGNOSIS — E1142 Type 2 diabetes mellitus with diabetic polyneuropathy: Secondary | ICD-10-CM | POA: Diagnosis not present

## 2023-09-28 DIAGNOSIS — R079 Chest pain, unspecified: Secondary | ICD-10-CM | POA: Diagnosis not present

## 2023-09-28 DIAGNOSIS — E782 Mixed hyperlipidemia: Secondary | ICD-10-CM

## 2023-09-28 DIAGNOSIS — I25110 Atherosclerotic heart disease of native coronary artery with unstable angina pectoris: Secondary | ICD-10-CM

## 2023-09-28 DIAGNOSIS — R55 Syncope and collapse: Secondary | ICD-10-CM

## 2023-09-28 LAB — CBC
HCT: 36.6 % (ref 36.0–46.0)
Hemoglobin: 12.3 g/dL (ref 12.0–15.0)
MCH: 30.3 pg (ref 26.0–34.0)
MCHC: 33.6 g/dL (ref 30.0–36.0)
MCV: 90.1 fL (ref 80.0–100.0)
Platelets: 230 K/uL (ref 150–400)
RBC: 4.06 MIL/uL (ref 3.87–5.11)
RDW: 12.8 % (ref 11.5–15.5)
WBC: 9.7 K/uL (ref 4.0–10.5)
nRBC: 0 % (ref 0.0–0.2)

## 2023-09-28 LAB — TROPONIN I (HIGH SENSITIVITY): Troponin I (High Sensitivity): 4 ng/L (ref <18)

## 2023-09-28 LAB — HEPARIN LEVEL (UNFRACTIONATED)
Heparin Unfractionated: 0.51 [IU]/mL (ref 0.30–0.70)
Heparin Unfractionated: 0.51 [IU]/mL (ref 0.30–0.70)
Heparin Unfractionated: 0.49 [IU]/mL (ref 0.30–0.70)

## 2023-09-28 LAB — LIPID PANEL
Cholesterol: 109 mg/dL (ref 0–200)
HDL: 40 mg/dL — ABNORMAL LOW (ref >40)
LDL Cholesterol: 48 mg/dL (ref 0–99)
Total CHOL/HDL Ratio: 2.7 ratio
Triglycerides: 104 mg/dL (ref <150)
VLDL: 21 mg/dL (ref 0–40)

## 2023-09-28 LAB — CBG MONITORING, ED
Glucose-Capillary: 136 mg/dL — ABNORMAL HIGH (ref 70–99)
Glucose-Capillary: 167 mg/dL — ABNORMAL HIGH (ref 70–99)
Glucose-Capillary: 151 mg/dL — ABNORMAL HIGH (ref 70–99)

## 2023-09-28 LAB — HEMOGLOBIN A1C
Hgb A1c MFr Bld: 6.6 % — ABNORMAL HIGH (ref 4.8–5.6)
Mean Plasma Glucose: 142.72 mg/dL

## 2023-09-28 LAB — HIV ANTIBODY (ROUTINE TESTING W REFLEX): HIV Screen 4th Generation wRfx: NONREACTIVE

## 2023-09-28 LAB — GLUCOSE, CAPILLARY: Glucose-Capillary: 205 mg/dL — ABNORMAL HIGH (ref 70–99)

## 2023-09-28 MED ORDER — DIPHENHYDRAMINE HCL 25 MG PO CAPS
25.0000 mg | ORAL_CAPSULE | Freq: Once | ORAL | Status: AC
  Administered 2023-09-28: 25 mg via ORAL
  Filled 2023-09-28: qty 1

## 2023-09-28 MED ORDER — AMLODIPINE BESYLATE 5 MG PO TABS
5.0000 mg | ORAL_TABLET | Freq: Every day | ORAL | Status: DC
  Administered 2023-09-28 – 2023-09-30 (×3): 5 mg via ORAL
  Filled 2023-09-28 (×3): qty 1

## 2023-09-28 MED ORDER — ATORVASTATIN CALCIUM 20 MG PO TABS
40.0000 mg | ORAL_TABLET | Freq: Every day | ORAL | Status: DC
  Administered 2023-09-28 – 2023-09-30 (×3): 40 mg via ORAL
  Filled 2023-09-28 (×3): qty 2

## 2023-09-28 NOTE — Consult Note (Signed)
 Pharmacy Consult Note - Anticoagulation  Pharmacy Consult for Heparin  infusion Indication: chest pain/ACS Allergies  Allergen Reactions   Morphine And Codeine Palpitations    PATIENT MEASUREMENTS: Height: 5' 2 (157.5 cm) Weight: 98.4 kg (217 lb) IBW/kg (Calculated) : 50.1 HEPARIN  DW (KG): 73.4  VITAL SIGNS: Temp: 98.3 F (36.8 C) (09/19 2130) Temp Source: Oral (09/19 2130) BP: 124/56 (09/20 0213) Pulse Rate: 57 (09/20 0213)  Recent Labs    09/27/23 1736 09/27/23 1856 09/27/23 2229 09/28/23 0150  HGB 13.4  --   --  12.3  HCT 39.7  --   --  36.6  PLT 260  --   --  230  APTT  --  32  --   --   LABPROT  --  13.6  --   --   INR  --  1.0  --   --   HEPARINUNFRC  --   --   --  0.51  CREATININE 1.40*  --   --   --   TROPONINIHS 4 4 5   --     Estimated Creatinine Clearance: 46.8 mL/min (A) (by C-G formula based on SCr of 1.4 mg/dL (H)).  PAST MEDICAL HISTORY: Past Medical History:  Diagnosis Date   Asthma    COVID-19 07/15/2019   diagnosed in outpatient clinic on 07/15/2019   Diabetes mellitus without complication (HCC)    History of hiatal hernia    Hypertension    PONV (postoperative nausea and vomiting)     Medications:  (Not in a hospital admission)  Scheduled:   insulin  aspart  0-20 Units Subcutaneous TID WC   insulin  aspart  0-5 Units Subcutaneous QHS   metoprolol  succinate  12.5 mg Oral Daily   pantoprazole   40 mg Oral Daily   Infusions:   sodium chloride  40 mL/hr at 09/27/23 2010   heparin  1,050 Units/hr (09/27/23 2004)    ASSESSMENT: 58 y.o. female with PMH HTN, DM is presenting with chest pain and abnormal cardiac CT showing hemodynamically significant stenosis in the proximal LAD and proximal-mid RCA with a recommendation of cardiac catheterization. Patient is not on chronic anticoagulation per chart review. Pharmacy has been consulted to initiate and manage heparin  intravenous infusion.   Goal(s) of therapy: Heparin  level 0.3 - 0.7  units/mL aPTT 66 - 102 seconds Monitor platelets by anticoagulation protocol: Yes   Baseline anticoagulation labs: Recent Labs    09/27/23 1736 09/27/23 1856 09/28/23 0150  APTT  --  32  --   INR  --  1.0  --   HGB 13.4  --  12.3  PLT 260  --  230   Baseline aPTT, INR pending  0920 0150 HL 0.51, therapeutic x 1  PLAN:  Continue heparin  infusion at 1050 units/hour.  Recheck heparin  level in 6 hours to confirm, then daily once at least two levels are consecutively therapeutic.  Monitor CBC daily while on heparin  infusion.  Rankin CANDIE Dills, PharmD, MBA 09/28/2023 3:07 AM

## 2023-09-28 NOTE — Consult Note (Signed)
 Pharmacy Consult Note - Anticoagulation  Pharmacy Consult for Heparin  infusion Indication: chest pain/ACS Allergies  Allergen Reactions   Morphine And Codeine Palpitations    PATIENT MEASUREMENTS: Height: 5' 2 (157.5 cm) Weight: 98.4 kg (217 lb) IBW/kg (Calculated) : 50.1 HEPARIN  DW (KG): 73.4  VITAL SIGNS: Temp: 98.6 F (37 C) (09/20 0610) Temp Source: Oral (09/20 0610) BP: 118/56 (09/20 0610) Pulse Rate: 57 (09/20 0610)  Recent Labs    09/27/23 1736 09/27/23 1736 09/27/23 1856 09/27/23 2229 09/28/23 0150 09/28/23 0446  HGB 13.4  --   --   --  12.3  --   HCT 39.7  --   --   --  36.6  --   PLT 260  --   --   --  230  --   APTT  --   --  32  --   --   --   LABPROT  --   --  13.6  --   --   --   INR  --   --  1.0  --   --   --   HEPARINUNFRC  --    < >  --   --  0.51 0.51  CREATININE 1.40*  --   --   --   --   --   TROPONINIHS 4  --  4 5  --   --    < > = values in this interval not displayed.    Estimated Creatinine Clearance: 46.8 mL/min (A) (by C-G formula based on SCr of 1.4 mg/dL (H)).  PAST MEDICAL HISTORY: Past Medical History:  Diagnosis Date   Asthma    COVID-19 07/15/2019   diagnosed in outpatient clinic on 07/15/2019   Diabetes mellitus without complication (HCC)    History of hiatal hernia    Hypertension    PONV (postoperative nausea and vomiting)     Medications:  (Not in a hospital admission)  Scheduled:   insulin  aspart  0-20 Units Subcutaneous TID WC   insulin  aspart  0-5 Units Subcutaneous QHS   metoprolol  succinate  12.5 mg Oral Daily   pantoprazole   40 mg Oral Daily   Infusions:   sodium chloride  40 mL/hr at 09/27/23 2010   heparin  1,050 Units/hr (09/27/23 2004)    ASSESSMENT: 58 y.o. female with PMH HTN, DM is presenting with chest pain and abnormal cardiac CT showing hemodynamically significant stenosis in the proximal LAD and proximal-mid RCA with a recommendation of cardiac catheterization. Patient is not on chronic  anticoagulation per chart review. Pharmacy has been consulted to initiate and manage heparin  intravenous infusion.  0920 0150 HL 0.51, therapeutic x 1 0920 0446 HL 0.51   Goal(s) of therapy: Heparin  level 0.3 - 0.7 units/mL Monitor platelets by anticoagulation protocol: Yes   Baseline anticoagulation labs: Recent Labs    09/27/23 1736 09/27/23 1856 09/28/23 0150  APTT  --  32  --   INR  --  1.0  --   HGB 13.4  --  12.3  PLT 260  --  230    PLAN: Heparin  level was drawn early. Will get another heparin  level around 1000 to confirm heparin  is therapeutic.   Cathaleen Blanch, PharmD, BCPS 09/28/2023 7:05 AM

## 2023-09-28 NOTE — Consult Note (Signed)
 Pharmacy Consult Note - Anticoagulation  Pharmacy Consult for Heparin  infusion Indication: chest pain/ACS  Allergies  Allergen Reactions   Morphine And Codeine Palpitations    PATIENT MEASUREMENTS: Height: 5' 2 (157.5 cm) Weight: 98.4 kg (217 lb) IBW/kg (Calculated) : 50.1 HEPARIN  DW (KG): 73.4  VITAL SIGNS: Temp: 97.6 F (36.4 C) (09/20 1100) Temp Source: Oral (09/20 1100) BP: 152/65 (09/20 1100) Pulse Rate: 58 (09/20 1100)  Recent Labs    09/27/23 1736 09/27/23 1856 09/27/23 2229 09/28/23 0150 09/28/23 0446 09/28/23 1257  HGB 13.4  --   --  12.3  --   --   HCT 39.7  --   --  36.6  --   --   PLT 260  --   --  230  --   --   APTT  --  32  --   --   --   --   LABPROT  --  13.6  --   --   --   --   INR  --  1.0  --   --   --   --   HEPARINUNFRC  --   --    < > 0.51 0.51 0.49  CREATININE 1.40*  --   --   --   --   --   TROPONINIHS 4 4   < >  --  4  --    < > = values in this interval not displayed.    Estimated Creatinine Clearance: 46.8 mL/min (A) (by C-G formula based on SCr of 1.4 mg/dL (H)).  PAST MEDICAL HISTORY: Past Medical History:  Diagnosis Date   Asthma    COVID-19 07/15/2019   diagnosed in outpatient clinic on 07/15/2019   Diabetes mellitus without complication (HCC)    History of hiatal hernia    Hypertension    PONV (postoperative nausea and vomiting)     Medications:  (Not in a hospital admission)  Scheduled:   amLODipine   5 mg Oral Daily   atorvastatin   40 mg Oral Daily   insulin  aspart  0-20 Units Subcutaneous TID WC   insulin  aspart  0-5 Units Subcutaneous QHS   metoprolol  succinate  12.5 mg Oral Daily   pantoprazole   40 mg Oral Daily   Infusions:   heparin  1,050 Units/hr (09/28/23 0949)    ASSESSMENT: 58 y.o. female with PMH HTN, DM is presenting with chest pain and abnormal cardiac CT showing hemodynamically significant stenosis in the proximal LAD and proximal-mid RCA with a recommendation of cardiac catheterization. Patient is  not on chronic anticoagulation per chart review. Pharmacy has been consulted to initiate and manage heparin  intravenous infusion.  0920 0150 HL 0.51, therapeutic x 1 0920 0446 HL 0.51 0920 1257 HL 0.49, therapeutic x 2    Goal(s) of therapy: Heparin  level 0.3 - 0.7 units/mL Monitor platelets by anticoagulation protocol: Yes   PLAN: - Will continue current rate of heparin  infusion  - Will monitor CBC and HL daily    Ransom Blanch PGY-1 Pharmacy Resident  [place] [facility]  09/28/2023 1:27 PM

## 2023-09-28 NOTE — Progress Notes (Signed)
 [facility]  - [place] [facility]   PATIENT NAME: Jennifer Lozano    MR#:  [id number]  DATE OF BIRTH:  06-Aug-1965  SUBJECTIVE:  Family at bedside Pt came in with cp and was also found to have abnormal CT coronary Currently on IV heparin  gtt and seen by Cardiology for cath on monday    VITALS:  Blood pressure (!) 152/65, pulse (!) 58, temperature 97.6 F (36.4 C), temperature source Oral, resp. rate 19, height 5' 2 (1.575 m), weight 98.4 kg, SpO2 98%.  PHYSICAL EXAMINATION:   GENERAL:  58 y.o.-year-old patient with no acute distress. Obese LUNGS: Normal breath sounds bilaterally, no wheezing CARDIOVASCULAR: S1, S2 normal. No murmur   ABDOMEN: Soft, nontender, nondistended.  EXTREMITIES: No  edema b/l.    NEUROLOGIC: nonfocal  patient is alert and awake   LABORATORY PANEL:  CBC Recent Labs  Lab 09/28/23 0150  WBC 9.7  HGB 12.3  HCT 36.6  PLT 230    Chemistries  Recent Labs  Lab 09/27/23 1736  NA 139  K 3.7  CL 101  CO2 27  GLUCOSE 166*  BUN 25*  CREATININE 1.40*  CALCIUM  8.7*   Cardiac Enzymes No results for input(s): TROPONINI in the last 168 hours. RADIOLOGY:  DG Chest 2 View Result Date: 09/27/2023 CLINICAL DATA:  Chest pain, presyncope, coronary artery disease EXAM: CHEST - 2 VIEW COMPARISON:  09/27/2023, 07/24/2019 FINDINGS: Frontal and lateral views of the chest demonstrate an unremarkable cardiac silhouette. Chronic bilateral upper lobe scarring. No acute airspace disease, effusion, or pneumothorax. No acute bony abnormalities. IMPRESSION: 1. Stable chest, no acute process. Electronically Signed   By: Ozell Daring M.D.   On: 09/27/2023 18:00   CT CORONARY FRACTIONAL FLOW RESERVE FLUID ANALYSIS Result Date: 09/27/2023 EXAM: CT FFR analysis was performed on the original cardiac CTA dataset. Diagrammatic representation of the CT FFR analysis is provided in a separate PDF document in PACS. This dictation was created using the PDF  document and an interactive 3D model of the results. The 3D model is not available in the EMR/PACS. INTERPRETATION: CT FFR provides simultaneous calculation of pressure and flow across the entire coronary tree. For clinical decision making, CT FFR values should be obtained 1-2 cm distal to the lower border of each stenosis measured. Coronary CTA-related artifacts may impair the diagnostic accuracy of the original cardiac CTA and FFR CT results. *Due to the fact that CT FFR represents a mathematically-derived analysis, it is recommended that the results be interpreted as follows: 1. CT FFR >0.80: Low likelihood of hemodynamic significance. 2. CT FFR 0.76-0.80: Borderline likelihood of hemodynamic significance. 3. CT FFR =< 0.75: High likelihood of hemodynamic significance. *Coronary CT Angiography-derived Fractional Flow Reserve Testing in Patients with Stable Coronary Artery Disease: Recommendations on Interpretation and Reporting. Radiology: Cardiothoracic Imaging. 2019;1(5):e190050 FINDINGS: 1. Left Main: Low likelihood of hemodynamic significance. 2. LAD: High likelihood of hemodynamic significance. There are two serial lesions, the first LAD lesions has FFR 0.78 but delta FFR of 0.20. There is then a second prox LAD lesion with FFr 0.62 and delta FFR 0.15. 3. LCX: Low likelihood of hemodynamic significance. 4. RCA: High likelihood of hemodynamic significance, FFR of prox-mid RCA is 0.70. PDA not mapped. IMPRESSION: 1. CT FFR analysis showed hemodynamically significant stenosis in the proximal LAD and proximal-mid RCA. RECOMMENDATIONS: Recommend cardiac catheterization. Guideline directed medical therapy for secondary prevention of CAD. Electronically Signed   By: Soyla Merck M.D.   On: 09/27/2023 13:45  CT CORONARY MORPH W/CTA COR W/SCORE W/CA W/CM &/OR WO/CM Result Date: 09/27/2023 CLINICAL DATA:  Chest pain, nonspecific ECG abnormal, high CAD risk EXAM: Cardiac/Coronary CTA TECHNIQUE: A non-contrast,  gated CT scan was obtained with axial slices of 2.5 mm through the heart for calcium  scoring. Calcium  scoring was performed using the Agatston method. A 120 kV prospective, gated, contrast cardiac CT scan was obtained. Gantry rotation speed was 230 msec and collimation was 0.63 mm. Two sublingual nitroglycerin  tablets (0.8 mg) were given. The 3D data set was reconstructed with motion correction for the best systolic or diastolic phase. Images were analyzed on a dedicated workstation using MPR, MIP, and VRT modes. The patient received 95 cc of contrast. FINDINGS: Image quality: Average Noise artifact is: Moderately reduced SNR and CNR Coronary calcium  score is 248, which places the patient in the 95th percentile for age and sex matched control. Coronary arteries: Normal coronary origins.  Right dominance. Right Coronary Artery: Severe plaque proximal-mid RCA, 70-99% stenosis. Distal vessels not optimally seen but concern for severe stenosis in the proximal PDA, 70-99% stenosis. Left Main Coronary Artery: Mild plaque distal LM, 25-49% stenosis, challenging to visualize due to artifact. Left Anterior Descending Coronary Artery: Severe plaque proximal LAD, 70-99% stenosis, two serial lesions. Moderate mid LAD plaque, 50-69% stenosis. Diagonals difficult to assess due to image quality. Left Circumflex Artery: Moderate diffuse disease, nondominant. Aorta: Normal size, 29 mm at the mid ascending aorta (level of the PA bifurcation) measured double oblique. Aortic Valve: No calcifications. Other findings: Normal pulmonary vein drainage into the left atrium. Normal left atrial appendage without thrombus. Normal size of the pulmonary artery. Please see separate report from [facility] for non-cardiac findings. Total plaque volume not performed. IMPRESSION: 1. 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 CT FFR will be performed and reported separately. 2. Coronary  calcium  score is 248, which places the patient in the 95th percentile for age and sex matched control. 3. Normal coronary origins with right dominance. RECOMMENDATIONS: CAD-RADS 4 Severe stenosis. (70-99% or > 50% left main). Cardiac catheterization or CT FFR is recommended. Consider symptom-guided anti-ischemic pharmacotherapy as well as risk factor modification per guideline directed care. Electronically Signed   By: Soyla Merck M.D.   On: 09/27/2023 13:41    Assessment and Plan  Jennifer Lozano is a 58 y.o. female with medical history significant of type 2 diabetes, asthma, essential hypertension, hypothyroidism, hyperlipidemia, morbid obesity, who presented to the ER with complaint of chest pain.  Patient was apparently seen in the outpatient setting with chest pain.    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 CT FFR will be performed and reported separately.  2. Coronary calcium  score is 248, which places the patient in the 95th percentile for age and sex matched control.  3. Normal coronary origins with right dominance.  Chest pian with abnormal Ct Coronary --[facility] consult appreciated--Cont IV heparin  gtt --Cath planned for Monday --cont asa, statins and BB  HTN --cont bp meds  HL --on statins  Dm-2,hyperglycemia --SSI for now A1c 6.6    Procedures: Family communication :famliy in ER Consults :[facility] cards CODE STATUS: full DVT Prophylaxis :heparin  gtt Level of care: Telemetry Cardiac Status is: Inpatient Remains inpatient appropriate because: CAD--cath on monday    TOTAL TIME TAKING CARE OF THIS PATIENT: 40 minutes.  >50% time spent on counselling and coordination of care  Note: This  dictation was prepared with Dragon dictation along with smaller phrase technology. Any transcriptional errors that result from this process are unintentional.  Leita Blanch M.D    [facility]   CC: Primary  care physician; [place] HERO, MD

## 2023-09-28 NOTE — ED Notes (Addendum)
 Pt had IV removed from R a/c yesterday, now has bright red rash at site aprox 9x12cm.  Pt denies pain, does endorse itching.  Area outlined and provider notified.

## 2023-09-28 NOTE — Plan of Care (Signed)

## 2023-09-28 NOTE — Consult Note (Signed)
 Cardiology Consultation:   Patient ID: Jennifer Lozano; [id number]; [birth year]   Admit date: 09/27/2023 Date of Consult: 09/28/2023  Primary Care Provider: Buren Rock HERO, MD Primary Cardiologist: None Primary Electrophysiologist:  None   Patient Profile:   Jennifer Lozano is a 58 y.o. 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 Tobie.  History of Present Illness:   Jennifer Lozano was evaluated as part of her first clinic on 08/26/2023 following ER visit for syncope in July 2025.  Patient was seen in the ER on 07/20/2023 with syncope in the setting of nausea, vomiting, and diarrhea for 3 days in the context of nephrolithiasis that subsequently passed.  Cardiac enzymes not cycled.  EKG shows sinus rhythm without acute ischemic changes.  Imaging not obtained.  Subsequent carotid artery ultrasound in 08/2023 showed no evidence of hemodynamically significant stenosis of the bilateral internal carotid arteries with antegrade flow of the bilateral vertebral arteries and normal flow hemodynamics of the bilateral subclavian arteries.  Upon establishing care with our office she reported syncopal episodes while using the bathroom with associated severe cramps leading to severe diarrhea.  Syncopal episodes were not associated with any other activity outside of using the bathroom.  Subsequent Zio patch showed a predominant rhythm of sinus with an average rate of 69 bpm with 1 run of NSVT lasting 12 beats and 47 episodes of SVT lasting up to 16.2 seconds.  In this setting she was started on Toprol -XL 12.5 mg daily.  Echo was ordered and is pending.    Coronary CTA on 09/27/2023 with a calcium  score of 248 which was the 95th percentile.  There was a severe CAD involving the proximal LAD, proximal to mid RCA, and proximal PAD estimated at 70 to 99%  stenosis.  CT FFR showed significant stenosis in the proximal LAD and proximal to mid RCA as outlined below with recommendation for cardiac cath.  After undergoing coronary CTA the patient developed pinpoint sharp split-second lasting episodes of chest discomfort while driving home with some associated nausea with recommendation for patient to proceed to the ER.  She was admitted to [facility] on 01/27/2023 with unstable angina.  EKG showed sinus rhythm without acute ischemic changes.  High-sensitivity troponin negative x 4.  Chest x-ray without acute cardiopulmonary process.  She was given aspirin  324 mg x 1 and placed on a heparin  drip.  Currently, patient is without symptoms of angina or cardiac decompensation.    Past Medical History:  Diagnosis Date   Asthma    COVID-19 07/15/2019   diagnosed in outpatient clinic on 07/15/2019   Diabetes mellitus without complication (HCC)    History of hiatal hernia    Hypertension    PONV (postoperative nausea and vomiting)     Past Surgical History:  Procedure Laterality Date   ABDOMINAL HYSTERECTOMY     CARPAL TUNNEL RELEASE Right 10/08/2018   Procedure: CARPAL TUNNEL RELEASE ENDOSCOPIC;  Surgeon: Edie Norleen PARAS, MD;  Location: [facility] ORS;  Service: Orthopedics;  Laterality: Right;   CARPAL TUNNEL RELEASE Left 11/05/2018   Procedure: CARPAL TUNNEL RELEASE ENDOSCOPIC;  Surgeon: Edie Norleen PARAS, MD;  Location: [facility] ORS;  Service: Orthopedics;  Laterality: Left;   CATARACT EXTRACTION, BILATERAL     CHOLECYSTECTOMY     COLONOSCOPY WITH PROPOFOL  N/A 04/15/2017   Procedure: COLONOSCOPY WITH PROPOFOL ;  Surgeon:  Unk Corinn Skiff, MD;  Location: [facility] ENDOSCOPY;  Service: Gastroenterology;  Laterality: N/A;   DORSAL COMPARTMENT RELEASE Right 10/08/2018   Procedure: DE QUERVAIN'S TENOSYNOVITIS RELEASE;  Surgeon: Edie Norleen PARAS, MD;  Location: [facility] ORS;  Service: Orthopedics;  Laterality: Right;   TONSILLECTOMY       Home Meds: Prior to Admission medications    Medication Sig Start Date End Date Taking? Authorizing Provider  albuterol (VENTOLIN HFA) 108 (90 Base) MCG/ACT inhaler Inhale 2 puffs into the lungs. 12/20/21  Yes [provider]  amLODipine  (NORVASC ) 5 MG tablet Take 5 mg by mouth daily.   Yes [provider]  atorvastatin  (LIPITOR) 40 MG tablet Take 40 mg by mouth daily. 11/16/19  Yes [provider]  LANTUS SOLOSTAR 100 UNIT/ML Solostar Pen Inject 40 Units into the skin daily.   Yes [provider]  levothyroxine (SYNTHROID, LEVOTHROID) 125 MCG tablet Take 125 mcg by mouth daily before breakfast.  04/25/17  Yes [provider]  lisinopril-hydrochlorothiazide (PRINZIDE,ZESTORETIC) 20-25 MG tablet Take 1 tablet by mouth daily. 03/26/16  Yes [provider]  metoprolol  succinate (TOPROL -XL) 25 MG 24 hr tablet Take 0.5 tablets (12.5 mg total) by mouth daily. Take with or immediately following a meal. 09/24/23  Yes Furth, Cadence H, PA-C  NOVOLOG  100 UNIT/ML injection Inject into the skin continuous. Adjust insulin  pump according to carb intake 03/15/16  Yes [provider]  ondansetron  (ZOFRAN -ODT) 4 MG disintegrating tablet Take 1 tablet (4 mg total) by mouth every 8 (eight) hours as needed for nausea or vomiting. 07/20/23  Yes Viviann Pastor, MD  pantoprazole  (PROTONIX ) 40 MG tablet Take 40 mg by mouth daily. 09/17/23  Yes [provider]  pravastatin (PRAVACHOL) 40 MG tablet Take 40 mg by mouth daily. 09/16/23  Yes [provider]  pregabalin (LYRICA) 75 MG capsule Take 75 mg by mouth daily. 09/10/23  Yes [provider]  TRULICITY 4.5 MG/0.5ML SOAJ Inject 4.5 mg into the skin once a week. 06/14/23  Yes [provider]  azithromycin  (ZITHROMAX  Z-PAK) 250 MG tablet Take 2 tablets (500 mg) on  Day 1,  followed by 1 tablet (250 mg) once daily on Days 2 through 5. Patient not taking: Reported on 08/26/2023 03/09/19   Angelena Smalls, MD  Continuous Glucose Sensor  (DEXCOM G7 SENSOR) MISC 1 each. 08/17/22   [provider]  doxycycline (VIBRA-TABS) 100 MG tablet Take 100 mg by mouth 2 (two) times daily. Patient not taking: Reported on 09/27/2023 09/20/23   [provider]  famotidine  (PEPCID ) 20 MG tablet Take 1 tablet (20 mg total) by mouth 2 (two) times daily. Patient not taking: Reported on 09/27/2023 07/20/23   Viviann Pastor, MD  HYDROcodone -acetaminophen  (NORCO) 5-325 MG tablet Take 1-2 tablets by mouth every 6 (six) hours as needed for moderate pain. MAXIMUM TOTAL ACETAMINOPHEN  DOSE IS 4000 MG PER DAY Patient not taking: Reported on 09/27/2023 11/05/18   Poggi, John J, MD  ibuprofen (ADVIL) 200 MG tablet Take 800 mg by mouth every 8 (eight) hours as needed for moderate pain.  Patient not taking: Reported on 09/27/2023    [provider]  metoprolol  tartrate (LOPRESSOR ) 50 MG tablet TAKE 1 TABLET 2 HR PRIOR TO CARDIAC PROCEDURE Patient not taking: Reported on 09/27/2023 09/24/23   Furth, Cadence H, PA-C  predniSONE  (DELTASONE ) 10 MG tablet Take 4 tabs (40 mg) PO x 3 days, then take 2 tabs (20 mg) PO x 3 days, then take 1 tab (10 mg) PO  x 3 days, then take 1/2 tab (5 mg) PO x 4 days. Patient not taking: Reported on 08/26/2023 07/25/19   Gordan Huxley, MD    Inpatient Medications: Scheduled Meds:  insulin  aspart  0-20 Units Subcutaneous TID WC   insulin  aspart  0-5 Units Subcutaneous QHS   metoprolol  succinate  12.5 mg Oral Daily   pantoprazole   40 mg Oral Daily   Continuous Infusions:  heparin  1,050 Units/hr (09/28/23 0949)   PRN Meds: acetaminophen , ondansetron  (ZOFRAN ) IV  Allergies:   Allergies  Allergen Reactions   Morphine And Codeine Palpitations    Social History:   Social History   Socioeconomic History   Marital status: Married    Spouse name: Not on file   Number of children: Not on file   Years of education: Not on file   Highest education level: Not on file  Occupational History   Not on file   Tobacco Use   Smoking status: Never   Smokeless tobacco: Never  Vaping Use   Vaping status: Never Used  Substance and Sexual Activity   Alcohol use: No   Drug use: No   Sexual activity: Not on file  Other Topics Concern   Not on file  Social History Narrative   Not on file   Social Drivers of Health   Financial Resource Strain: Not on file  Food Insecurity: Not on file  Transportation Needs: Not on file  Physical Activity: Not on file  Stress: Not on file  Social Connections: Not on file  Intimate Partner Violence: Not on file     Family History:   Family History  Problem Relation Age of Onset   Heart disease Mother    Diabetes Mother    Heart Problems Mother    Diabetes Sister    Diabetes Brother     ROS:  Review of Systems  Constitutional:  Positive for malaise/fatigue. Negative for chills, diaphoresis, fever and weight loss.  HENT:  Negative for congestion.   Eyes:  Negative for discharge and redness.  Respiratory:  Negative for cough, sputum production, shortness of breath and wheezing.   Cardiovascular:  Positive for chest pain. Negative for palpitations, orthopnea, claudication, leg swelling and PND.  Gastrointestinal:  Positive for nausea. Negative for abdominal pain, heartburn and vomiting.  Musculoskeletal:  Negative for falls and myalgias.  Skin:  Negative for rash.  Neurological:  Positive for loss of consciousness and weakness. Negative for dizziness, tingling, tremors, sensory change, speech change and focal weakness.  Endo/Heme/Allergies:  Does not bruise/bleed easily.  Psychiatric/Behavioral:  Negative for substance abuse. The patient is not nervous/anxious.   All other systems reviewed and are negative.     Physical Exam/Data:   Vitals:   09/28/23 0213 09/28/23 0610 09/28/23 0830 09/28/23 1021  BP: (!) 124/56 (!) 118/56 137/60 135/63  Pulse: (!) 57 (!) 57 64 60  Resp: 20 19 13  (!) 22  Temp:  98.6 F (37 C)  98.2 F (36.8 C)  TempSrc:   Oral  Oral  SpO2: 98% 96% 100%   Weight:      Height:        Intake/Output Summary (Last 24 hours) at 09/28/2023 1029 Last data filed at 09/28/2023 1022 Gross per 24 hour  Intake 183.77 ml  Output 300 ml  Net -116.23 ml   Filed Weights   09/27/23 1732  Weight: 98.4 kg   Body mass index is 39.69 kg/m.   Physical Exam: General: Well developed, well nourished, in no  acute distress. Head: Normocephalic, atraumatic, sclera non-icteric, no xanthomas, nares without discharge.  Neck: Negative for carotid bruits. JVD not elevated. Lungs: Clear bilaterally to auscultation without wheezes, rales, or rhonchi. Breathing is unlabored. Heart: RRR with S1 S2. No murmurs, rubs, or gallops appreciated. Abdomen: Soft, non-tender, non-distended with normoactive bowel sounds. No hepatomegaly. No rebound/guarding. No obvious abdominal masses. Msk:  Strength and tone appear normal for age. Extremities: No clubbing or cyanosis. No edema. Distal pedal pulses are 2+ and equal bilaterally. Neuro: Alert and oriented X 3. No facial asymmetry. No focal deficit. Moves all extremities spontaneously. Psych:  Responds to questions appropriately with a normal affect.   EKG:  The EKG was personally reviewed and demonstrates: NSR, 67 bpm, first degree AV block, no acute st/t changes Telemetry:  Telemetry was personally reviewed and demonstrates: SR  Weights: [employer]   09/27/23 1732  Weight: 98.4 kg    Relevant CV Studies:  Coronary CTA 09/27/2023: Coronary calcium  score is 248, which places the patient in the 95th percentile for age and sex matched control.   Coronary arteries: Normal coronary origins.  Right dominance.   Right Coronary Artery: Severe plaque proximal-mid RCA, 70-99% stenosis. Distal vessels not optimally seen but concern for severe stenosis in the proximal PDA, 70-99% stenosis.   Left Main Coronary Artery: Mild plaque distal LM, 25-49% stenosis, challenging to visualize due to  artifact.   Left Anterior Descending Coronary Artery: Severe plaque proximal LAD, 70-99% stenosis, two serial lesions. Moderate mid LAD plaque, 50-69% stenosis. Diagonals difficult to assess due to image quality.   Left Circumflex Artery: Moderate diffuse disease, nondominant.   Aorta: Normal size, 29 mm at the mid ascending aorta (level of the PA bifurcation) measured double oblique.   Aortic Valve: No calcifications.   Other findings:   Normal pulmonary vein drainage into the left atrium.   Normal left atrial appendage without thrombus.   Normal size of the pulmonary artery.   Please see separate report from [facility] for non-cardiac findings.   Total plaque volume not performed.   IMPRESSION: 1. 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 CT FFR will be performed and reported separately.   2. Coronary calcium  score is 248, which places the patient in the 95th percentile for age and sex matched control.   3. Normal coronary origins with right dominance.   RECOMMENDATIONS: CAD-RADS 4 Severe stenosis. (70-99% or > 50% left main). Cardiac catheterization or CT FFR is recommended. Consider symptom-guided anti-ischemic pharmacotherapy as well as risk factor modification per guideline directed care.   ctFFR: FINDINGS: 1. Left Main: Low likelihood of hemodynamic significance.   2. LAD: High likelihood of hemodynamic significance. There are two serial lesions, the first LAD lesions has FFR 0.78 but delta FFR of 0.20. There is then a second prox LAD lesion with FFr 0.62 and delta FFR 0.15. 3. LCX: Low likelihood of hemodynamic significance. 4. RCA: High likelihood of hemodynamic significance, FFR of prox-mid RCA is 0.70. PDA not mapped.   IMPRESSION: 1. CT FFR analysis showed hemodynamically significant stenosis in the proximal LAD and proximal-mid RCA.   RECOMMENDATIONS: Recommend cardiac  catheterization.   Guideline directed medical therapy for secondary prevention of CAD. __________  Zio patch 08/2023: Patient had a min HR of 51 bpm, max HR of 211 bpm, and avg HR of 69 bpm. Predominant underlying rhythm was Sinus Rhythm. 1 run of Ventricular Tachycardia occurred lasting 12 beats with a max rate  of 162 bpm (avg 144 bpm). 47 Supraventricular Tachycardia  runs occurred, the run with the fastest interval lasting 7 beats with a max rate of 211 bpm, the longest lasting 16.2 secs with an avg rate of 131 bpm. Isolated SVEs were rare (<1.0%), SVE Couplets were rare (<1.0%), and SVE Triplets were rare (<1.0%).  Isolated VEs were rare (<1.0%), and no VE Couplets or VE Triplets were present. Ventricular Trigeminy was present.    Conclusion Average heart rate 69, range 51-2 11. Paroxysmal atrial fibrillation noted ( strip 7, episodes of A-fib was incorrectly labeled as SVT). 1 run of nonsustained VT lasting 12 beats. Episodes of nonsustained SVT, longest lasting 16 seconds. ___________  Carotid artery disease 08/19/2023: Summary:  Right Carotid: There was no evidence of thrombus, dissection,  Atherosclerotic plaque or stenosis in the cervical carotid system.   Left Carotid: The extracranial vessels were near-normal with only minimal  wall thickening or plaque.   Vertebrals:  Bilateral vertebral arteries demonstrate antegrade flow.  Subclavians: Normal flow hemodynamics were seen in bilateral subclavian arteries.    Laboratory Data:  Chemistry Recent Labs  Lab 09/27/23 1736  NA 139  K 3.7  CL 101  CO2 27  GLUCOSE 166*  BUN 25*  CREATININE 1.40*  CALCIUM  8.7*  GFRNONAA 43*  ANIONGAP 11    No results for input(s): PROT, ALBUMIN, AST, ALT, ALKPHOS, BILITOT in the last 168 hours. Hematology Recent Labs  Lab 09/27/23 1736 09/28/23 0150  WBC 8.0 9.7  RBC 4.47 4.06  HGB 13.4 12.3  HCT 39.7 36.6  MCV 88.8 90.1  MCH 30.0 30.3  MCHC 33.8 33.6  RDW 12.8  12.8  PLT 260 230   Cardiac EnzymesNo results for input(s): TROPONINI in the last 168 hours. No results for input(s): TROPIPOC in the last 168 hours.  BNPNo results for input(s): BNP, PROBNP in the last 168 hours.  DDimer No results for input(s): DDIMER in the last 168 hours.  Radiology/Studies:  DG Chest 2 View Result Date: 09/27/2023 IMPRESSION: 1. Stable chest, no acute process. Electronically Signed   By: Ozell Daring M.D.   On: 09/27/2023 18:00   CT CORONARY FRACTIONAL FLOW RESERVE FLUID ANALYSIS Result Date: 09/27/2023 IMPRESSION: 1. CT FFR analysis showed hemodynamically significant stenosis in the proximal LAD and proximal-mid RCA. RECOMMENDATIONS: Recommend cardiac catheterization. Guideline directed medical therapy for secondary prevention of CAD. Electronically Signed   By: Soyla Merck M.D.   On: 09/27/2023 13:45   CT CORONARY MORPH W/CTA COR W/SCORE W/CA W/CM &/OR WO/CM Result Date: 09/27/2023 IMPRESSION: 1. 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 CT FFR will be performed and reported separately. 2. Coronary calcium  score is 248, which places the patient in the 95th percentile for age and sex matched control. 3. Normal coronary origins with right dominance. RECOMMENDATIONS: CAD-RADS 4 Severe stenosis. (70-99% or > 50% left main). Cardiac catheterization or CT FFR is recommended. Consider symptom-guided anti-ischemic pharmacotherapy as well as risk factor modification per guideline directed care. Electronically Signed   By: Soyla Merck M.D.   On: 09/27/2023 13:41    Assessment and Plan:   1. Multivessel CAD involving the native coronary arteries with unstable angina: - Currently without symptoms of angina or cardiac decompensation overall, chest pain symptoms are fairly atypical, however recent coronary CTA showed severe multivessel CAD - Reasonable to continue heparin  drip over the weekend - ASA 81 mg -  N.p.o. at midnight going into 9/22 (timing  of cath to be determined, therefore patient will be n.p.o.) - Plan for [facility] on 9/22 - Obtain echo  2. History of syncope: - Occurs in the setting of BM with associated severe abdominal cramping, possibly vasovagal - Found to have significant multivessel CAD on coronary CTA with plans for [facility] as above - Monitor on telemetry - Obtain echo  3. PSVT/NSVT: - Quiescent on telemetry - Continue recently started Toprol -XL  4. HTN: - Blood pressure currently well-controlled - Recommend resuming PTA amlodipine  5 mg, lisinopril/HCTZ, with continuation of Toprol -XL  5. HLD: - LDL 48 - Lipitor 40 mg in place of pravastatin with multivessel CAD  6. IDDM: - A1c 6.6 - SSI per IM   Informed Consent   Shared Decision Making/Informed Consent{  The risks [stroke (1 in 1000), death (1 in 1000), kidney failure [usually temporary] (1 in 500), bleeding (1 in 200), allergic reaction [possibly serious] (1 in 200)], benefits (diagnostic support and management of coronary artery disease) and alternatives of a cardiac catheterization were discussed in detail with Jennifer Lozano and she is willing to proceed.       For questions or updates, please contact [facility] Please consult [email or web address] for contact info under Cardiology/STEMI.   Signed, Bernardino Bring, PA-C [facility] Pager: [phone] 09/28/2023, 10:29 AM

## 2023-09-29 DIAGNOSIS — E785 Hyperlipidemia, unspecified: Secondary | ICD-10-CM | POA: Diagnosis not present

## 2023-09-29 DIAGNOSIS — E1142 Type 2 diabetes mellitus with diabetic polyneuropathy: Secondary | ICD-10-CM | POA: Diagnosis not present

## 2023-09-29 DIAGNOSIS — I25118 Atherosclerotic heart disease of native coronary artery with other forms of angina pectoris: Secondary | ICD-10-CM | POA: Diagnosis not present

## 2023-09-29 DIAGNOSIS — I10 Essential (primary) hypertension: Secondary | ICD-10-CM | POA: Diagnosis not present

## 2023-09-29 DIAGNOSIS — R079 Chest pain, unspecified: Secondary | ICD-10-CM | POA: Diagnosis not present

## 2023-09-29 DIAGNOSIS — E782 Mixed hyperlipidemia: Secondary | ICD-10-CM | POA: Diagnosis not present

## 2023-09-29 LAB — CBC
HCT: 40 % (ref 36.0–46.0)
Hemoglobin: 13.3 g/dL (ref 12.0–15.0)
MCH: 29.8 pg (ref 26.0–34.0)
MCHC: 33.3 g/dL (ref 30.0–36.0)
MCV: 89.7 fL (ref 80.0–100.0)
Platelets: 259 K/uL (ref 150–400)
RBC: 4.46 MIL/uL (ref 3.87–5.11)
RDW: 12.9 % (ref 11.5–15.5)
WBC: 8.9 K/uL (ref 4.0–10.5)
nRBC: 0 % (ref 0.0–0.2)

## 2023-09-29 LAB — HEPARIN LEVEL (UNFRACTIONATED): Heparin Unfractionated: 0.53 [IU]/mL (ref 0.30–0.70)

## 2023-09-29 LAB — GLUCOSE, CAPILLARY
Glucose-Capillary: 176 mg/dL — ABNORMAL HIGH (ref 70–99)
Glucose-Capillary: 226 mg/dL — ABNORMAL HIGH (ref 70–99)
Glucose-Capillary: 153 mg/dL — ABNORMAL HIGH (ref 70–99)

## 2023-09-29 NOTE — H&P (View-Only) (Signed)
 Progress Note  Patient Name: Jennifer Lozano Date of Encounter: 09/29/2023  Primary Cardiologist: New - consult by Perla  Subjective   Planning for [facility] 9/22. No chest pain or SOB since admission.   Inpatient Medications    Scheduled Meds:  amLODipine   5 mg Oral Daily   atorvastatin   40 mg Oral Daily   insulin  aspart  0-20 Units Subcutaneous TID WC   insulin  aspart  0-5 Units Subcutaneous QHS   metoprolol  succinate  12.5 mg Oral Daily   pantoprazole   40 mg Oral Daily   Continuous Infusions:  heparin  1,050 Units/hr (09/29/23 0400)   PRN Meds: acetaminophen , ondansetron  (ZOFRAN ) IV   Vital Signs    Vitals:   09/28/23 1900 09/28/23 2349 09/29/23 0354 09/29/23 0853  BP: 111/70 (!) 113/48 (!) 110/50 133/71  Pulse: (!) 58 60 74 65  Resp: (!) 24 19 16 16   Temp: 98 F (36.7 C) 97.9 F (36.6 C) 98.4 F (36.9 C) 98.6 F (37 C)  TempSrc: Oral   Oral  SpO2: 99% 96% 98% 99%  Weight:      Height:        Intake/Output Summary (Last 24 hours) at 09/29/2023 0923 Last data filed at 09/29/2023 0400 Gross per 24 hour  Intake 1197.25 ml  Output 300 ml  Net 897.25 ml   Filed Weights   09/27/23 1732  Weight: 98.4 kg    Telemetry    SR - Personally Reviewed  ECG    No new tracings - Personally Reviewed  Physical Exam   GEN: No acute distress.   Neck: No JVD. Cardiac: RRR, no murmurs, rubs, or gallops.  Respiratory: Clear to auscultation bilaterally.  GI: Soft, nontender, non-distended.   MS: No edema; No deformity. Neuro:  Alert and oriented x 3; Nonfocal.  Psych: Normal affect.  Labs    Chemistry Recent Labs  Lab 09/27/23 1736  NA 139  K 3.7  CL 101  CO2 27  GLUCOSE 166*  BUN 25*  CREATININE 1.40*  CALCIUM  8.7*  GFRNONAA 43*  ANIONGAP 11     Hematology Recent Labs  Lab 09/27/23 1736 09/28/23 0150 09/29/23 0447  WBC 8.0 9.7 8.9  RBC 4.47 4.06 4.46  HGB 13.4 12.3 13.3  HCT 39.7 36.6 40.0  MCV 88.8 90.1 89.7  MCH 30.0 30.3 29.8  MCHC  33.8 33.6 33.3  RDW 12.8 12.8 12.9  PLT 260 230 259    Cardiac EnzymesNo results for input(s): TROPONINI in the last 168 hours. No results for input(s): TROPIPOC in the last 168 hours.   BNPNo results for input(s): BNP, PROBNP in the last 168 hours.   DDimer No results for input(s): DDIMER in the last 168 hours.   Radiology    DG Chest 2 View Result Date: 09/27/2023 IMPRESSION: 1. Stable chest, no acute process. Electronically Signed   By: Ozell Daring M.D.   On: 09/27/2023 18:00   Cardiac Studies   CT CORONARY FRACTIONAL FLOW RESERVE FLUID ANALYSIS Result Date: 09/27/2023 IMPRESSION: 1. CT FFR analysis showed hemodynamically significant stenosis in the proximal LAD and proximal-mid RCA. RECOMMENDATIONS: Recommend cardiac catheterization. Guideline directed medical therapy for secondary prevention of CAD. Electronically Signed   By: Soyla Merck M.D.   On: 09/27/2023 13:45   CT CORONARY MORPH W/CTA COR W/SCORE W/CA W/CM &/OR WO/CM Result Date: 09/27/2023 IMPRESSION: 1. 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 CT FFR will be performed and reported separately. 2.  Coronary calcium  score is 248, which places the patient in the 95th percentile for age and sex matched control. 3. Normal coronary origins with right dominance. RECOMMENDATIONS: CAD-RADS 4 Severe stenosis. (70-99% or > 50% left main). Cardiac catheterization or CT FFR is recommended. Consider symptom-guided anti-ischemic pharmacotherapy as well as risk factor modification per guideline directed care. Electronically Signed   By: Soyla Merck M.D.   On: 09/27/2023 13:41   Patient Profile     58 y.o. 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 Tobie.   Assessment & Plan    1. Multivessel  CAD involving the native coronary arteries with unstable angina: - Currently without symptoms of angina or cardiac decompensation overall, chest pain symptoms are fairly atypical, however recent coronary CTA showed severe multivessel CAD - Continue heparin  drip over the weekend - ASA 81 mg - Lipitor 40 mg - N.p.o. at midnight going into 9/22 (timing of cath to be determined, therefore patient will be n.p.o.) - Plan for [facility] on 9/22 - Obtain echo - Further recommendations pending cath results   2. History of syncope: - Occurs in the setting of BM with associated severe abdominal cramping, possibly vasovagal - Found to have significant multivessel CAD on coronary CTA with plans for LHC as above - Monitor on telemetry - Obtain echo   3. PSVT/NSVT: - Quiescent on telemetry - Continue recently started Toprol -XL 12.5 mg   4. HTN: - Blood pressure currently well-controlled - PTA amlodipine  5 mg and Toprol -XL 12.5 mg   5. HLD: - LDL 48 - Lipitor 40 mg in place of pravastatin with multivessel CAD   6. IDDM: - A1c 6.6 - SSI per IM   Informed Consent   Shared Decision Making/Informed Consent{  The risks [stroke (1 in 1000), death (1 in 1000), kidney failure [usually temporary] (1 in 500), bleeding (1 in 200), allergic reaction [possibly serious] (1 in 200)], benefits (diagnostic support and management of coronary artery disease) and alternatives of a cardiac catheterization were discussed in detail with Jennifer Lozano and she is willing to proceed.      For questions or updates, please contact [facility] Please consult [email or web address] for contact info under Cardiology/STEMI.    Signed, Bernardino Bring, PA-C [facility] Pager: [phone] 09/29/2023, 9:23 AM

## 2023-09-29 NOTE — Progress Notes (Signed)
 [facility]  - [place] [facility]   PATIENT NAME: Jennifer Lozano    MR#:  [id number]  DATE OF BIRTH:  11/16/1965  SUBJECTIVE:  Family at bedside Pt came in with cp and was also found to have abnormal CT coronary Currently on IV heparin  gtt and seen by Cardiology for cath on Monday No cp at present  VITALS:  Blood pressure 133/71, pulse 65, temperature 98.6 F (37 C), temperature source Oral, resp. rate 16, height 5' 2 (1.575 m), weight 98.4 kg, SpO2 99%.  PHYSICAL EXAMINATION:   GENERAL:  58 y.o.-year-old patient with no acute distress. Obese LUNGS: Normal breath sounds bilaterally, no wheezing CARDIOVASCULAR: S1, S2 normal. No murmur   ABDOMEN: Soft, nontender, nondistended.  EXTREMITIES: No  edema b/l.    NEUROLOGIC: nonfocal  patient is alert and awake  LABORATORY PANEL:  CBC Recent Labs  Lab 09/29/23 0447  WBC 8.9  HGB 13.3  HCT 40.0  PLT 259    Chemistries  Recent Labs  Lab 09/27/23 1736  NA 139  K 3.7  CL 101  CO2 27  GLUCOSE 166*  BUN 25*  CREATININE 1.40*  CALCIUM  8.7*   Cardiac Enzymes No results for input(s): TROPONINI in the last 168 hours. RADIOLOGY:  DG Chest 2 View Result Date: 09/27/2023 CLINICAL DATA:  Chest pain, presyncope, coronary artery disease EXAM: CHEST - 2 VIEW COMPARISON:  09/27/2023, 07/24/2019 FINDINGS: Frontal and lateral views of the chest demonstrate an unremarkable cardiac silhouette. Chronic bilateral upper lobe scarring. No acute airspace disease, effusion, or pneumothorax. No acute bony abnormalities. IMPRESSION: 1. Stable chest, no acute process. Electronically Signed   By: Ozell Daring M.D.   On: 09/27/2023 18:00   CT CORONARY FRACTIONAL FLOW RESERVE FLUID ANALYSIS Result Date: 09/27/2023 EXAM: CT FFR analysis was performed on the original cardiac CTA dataset. Diagrammatic representation of the CT FFR analysis is provided in a separate PDF document in PACS. This dictation was created using the PDF  document and an interactive 3D model of the results. The 3D model is not available in the EMR/PACS. INTERPRETATION: CT FFR provides simultaneous calculation of pressure and flow across the entire coronary tree. For clinical decision making, CT FFR values should be obtained 1-2 cm distal to the lower border of each stenosis measured. Coronary CTA-related artifacts may impair the diagnostic accuracy of the original cardiac CTA and FFR CT results. *Due to the fact that CT FFR represents a mathematically-derived analysis, it is recommended that the results be interpreted as follows: 1. CT FFR >0.80: Low likelihood of hemodynamic significance. 2. CT FFR 0.76-0.80: Borderline likelihood of hemodynamic significance. 3. CT FFR =< 0.75: High likelihood of hemodynamic significance. *Coronary CT Angiography-derived Fractional Flow Reserve Testing in Patients with Stable Coronary Artery Disease: Recommendations on Interpretation and Reporting. Radiology: Cardiothoracic Imaging. 2019;1(5):e190050 FINDINGS: 1. Left Main: Low likelihood of hemodynamic significance. 2. LAD: High likelihood of hemodynamic significance. There are two serial lesions, the first LAD lesions has FFR 0.78 but delta FFR of 0.20. There is then a second prox LAD lesion with FFr 0.62 and delta FFR 0.15. 3. LCX: Low likelihood of hemodynamic significance. 4. RCA: High likelihood of hemodynamic significance, FFR of prox-mid RCA is 0.70. PDA not mapped. IMPRESSION: 1. CT FFR analysis showed hemodynamically significant stenosis in the proximal LAD and proximal-mid RCA. RECOMMENDATIONS: Recommend cardiac catheterization. Guideline directed medical therapy for secondary prevention of CAD. Electronically Signed   By: Soyla Merck M.D.   On: 09/27/2023 13:45  Assessment and Plan  Jennifer Lozano is a 58 y.o. female with medical history significant of type 2 diabetes, asthma, essential hypertension, hypothyroidism, hyperlipidemia, morbid obesity, who  presented to the ER with complaint of chest pain.  Patient was apparently seen in the outpatient setting with chest pain.    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 CT FFR will be performed and reported separately.  2. Coronary calcium  score is 248, which places the patient in the 95th percentile for age and sex matched control.  3. Normal coronary origins with right dominance.  Chest pian with abnormal Ct Coronary --[facility] consult appreciated--Cont IV heparin  gtt --Cath planned for Monday --cont asa, statins and BB  HTN --cont bp meds  HL --on statins  Dm-2,hyperglycemia --SSI for now A1c 6.6    Procedures: Family communication :famliy at bedside Consults :[facility] cards CODE STATUS: full DVT Prophylaxis :heparin  gtt Level of care: Telemetry Cardiac Status is: Inpatient Remains inpatient appropriate because: CAD--cath on monday    TOTAL TIME TAKING CARE OF THIS PATIENT: 30 minutes.  >50% time spent on counselling and coordination of care  Note: This dictation was prepared with Dragon dictation along with smaller phrase technology. Any transcriptional errors that result from this process are unintentional.  Leita Blanch M.D    [facility]   CC: Primary care physician; [place] HERO, MD

## 2023-09-29 NOTE — Consult Note (Signed)
 Pharmacy Consult Note - Anticoagulation  Pharmacy Consult for Heparin  infusion Indication: chest pain/ACS  Allergies  Allergen Reactions   Morphine And Codeine Palpitations    PATIENT MEASUREMENTS: Height: 5' 2 (157.5 cm) Weight: 98.4 kg (217 lb) IBW/kg (Calculated) : 50.1 HEPARIN  DW (KG): 73.4  VITAL SIGNS: Temp: 98.4 F (36.9 C) (09/21 0354) Temp Source: Oral (09/20 1900) BP: 110/50 (09/21 0354) Pulse Rate: 74 (09/21 0354)  Recent Labs    09/27/23 1736 09/27/23 1856 09/27/23 2229 09/28/23 0446 09/28/23 1257 09/29/23 0447  HGB 13.4  --    < >  --   --  13.3  HCT 39.7  --    < >  --   --  40.0  PLT 260  --    < >  --   --  259  APTT  --  32  --   --   --   --   LABPROT  --  13.6  --   --   --   --   INR  --  1.0  --   --   --   --   HEPARINUNFRC  --   --    < > 0.51   < > 0.53  CREATININE 1.40*  --   --   --   --   --   TROPONINIHS 4 4   < > 4  --   --    < > = values in this interval not displayed.    Estimated Creatinine Clearance: 46.8 mL/min (A) (by C-G formula based on SCr of 1.4 mg/dL (H)).  PAST MEDICAL HISTORY: Past Medical History:  Diagnosis Date   Asthma    COVID-19 07/15/2019   diagnosed in outpatient clinic on 07/15/2019   Diabetes mellitus without complication (HCC)    History of hiatal hernia    Hypertension    PONV (postoperative nausea and vomiting)     Medications:  Medications Prior to Admission  Medication Sig Dispense Refill Last Dose/Taking   albuterol (VENTOLIN HFA) 108 (90 Base) MCG/ACT inhaler Inhale 2 puffs into the lungs.   Unknown   amLODipine  (NORVASC ) 5 MG tablet Take 5 mg by mouth daily.   09/27/2023 Morning   atorvastatin  (LIPITOR) 40 MG tablet Take 40 mg by mouth daily.   09/26/2023 Evening   LANTUS SOLOSTAR 100 UNIT/ML Solostar Pen Inject 40 Units into the skin daily.   09/26/2023 Evening   levothyroxine (SYNTHROID, LEVOTHROID) 125 MCG tablet Take 125 mcg by mouth daily before breakfast.   1 09/27/2023 Morning    lisinopril-hydrochlorothiazide (PRINZIDE,ZESTORETIC) 20-25 MG tablet Take 1 tablet by mouth daily.  4 09/27/2023 Morning   metoprolol  succinate (TOPROL -XL) 25 MG 24 hr tablet Take 0.5 tablets (12.5 mg total) by mouth daily. Take with or immediately following a meal. 45 tablet 3 09/26/2023   NOVOLOG  100 UNIT/ML injection Inject into the skin continuous. Adjust insulin  pump according to carb intake  2 09/26/2023 Evening   ondansetron  (ZOFRAN -ODT) 4 MG disintegrating tablet Take 1 tablet (4 mg total) by mouth every 8 (eight) hours as needed for nausea or vomiting. 20 tablet 0 Unknown   pantoprazole  (PROTONIX ) 40 MG tablet Take 40 mg by mouth daily.   09/27/2023 Morning   pravastatin (PRAVACHOL) 40 MG tablet Take 40 mg by mouth daily.   09/27/2023 Morning   pregabalin (LYRICA) 75 MG capsule Take 75 mg by mouth daily.   Unknown   TRULICITY 4.5 MG/0.5ML SOAJ Inject 4.5 mg into the skin  once a week.   09/20/2023   azithromycin  (ZITHROMAX  Z-PAK) 250 MG tablet Take 2 tablets (500 mg) on  Day 1,  followed by 1 tablet (250 mg) once daily on Days 2 through 5. (Patient not taking: Reported on 08/26/2023) 6 each 0    Continuous Glucose Sensor (DEXCOM G7 SENSOR) MISC 1 each.      doxycycline (VIBRA-TABS) 100 MG tablet Take 100 mg by mouth 2 (two) times daily. (Patient not taking: Reported on 09/27/2023)   Not Taking   famotidine  (PEPCID ) 20 MG tablet Take 1 tablet (20 mg total) by mouth 2 (two) times daily. (Patient not taking: Reported on 09/27/2023) 60 tablet 0 Not Taking   HYDROcodone -acetaminophen  (NORCO) 5-325 MG tablet Take 1-2 tablets by mouth every 6 (six) hours as needed for moderate pain. MAXIMUM TOTAL ACETAMINOPHEN  DOSE IS 4000 MG PER DAY (Patient not taking: Reported on 09/27/2023) 30 tablet 0 Not Taking   ibuprofen (ADVIL) 200 MG tablet Take 800 mg by mouth every 8 (eight) hours as needed for moderate pain.  (Patient not taking: Reported on 09/27/2023)   Not Taking   metoprolol  tartrate (LOPRESSOR ) 50 MG tablet  TAKE 1 TABLET 2 HR PRIOR TO CARDIAC PROCEDURE (Patient not taking: Reported on 09/27/2023) 1 tablet 0 Not Taking   predniSONE  (DELTASONE ) 10 MG tablet Take 4 tabs (40 mg) PO x 3 days, then take 2 tabs (20 mg) PO x 3 days, then take 1 tab (10 mg) PO x 3 days, then take 1/2 tab (5 mg) PO x 4 days. (Patient not taking: Reported on 08/26/2023) 23 tablet 0    Scheduled:   amLODipine   5 mg Oral Daily   atorvastatin   40 mg Oral Daily   insulin  aspart  0-20 Units Subcutaneous TID WC   insulin  aspart  0-5 Units Subcutaneous QHS   metoprolol  succinate  12.5 mg Oral Daily   pantoprazole   40 mg Oral Daily   Infusions:   heparin  1,050 Units/hr (09/29/23 0400)    ASSESSMENT: 58 y.o. female with PMH HTN, DM is presenting with chest pain and abnormal cardiac CT showing hemodynamically significant stenosis in the proximal LAD and proximal-mid RCA with a recommendation of cardiac catheterization. Patient is not on chronic anticoagulation per chart review. Pharmacy has been consulted to initiate and manage heparin  intravenous infusion.  0920 0150 HL 0.51, therapeutic x 1 0920 0446 HL 0.51 0920 1257 HL 0.49, therapeutic x 2  0921 0447 HL 0.53   Goal(s) of therapy: Heparin  level 0.3 - 0.7 units/mL Monitor platelets by anticoagulation protocol: Yes   PLAN: Heparin  level is therapeutic. Will continue heparin  infusion at 1050 units/hr. Recheck heparin  level and CBC with Am labs.   Cathaleen Blanch, PharmD [facility]  09/29/2023 6:58 AM

## 2023-09-29 NOTE — Progress Notes (Signed)
 Progress Note  Patient Name: Jennifer Lozano Date of Encounter: 09/29/2023  Primary Cardiologist: New - consult by Perla  Subjective   Planning for [facility] 9/22. No chest pain or SOB since admission.   Inpatient Medications    Scheduled Meds:  amLODipine   5 mg Oral Daily   atorvastatin   40 mg Oral Daily   insulin  aspart  0-20 Units Subcutaneous TID WC   insulin  aspart  0-5 Units Subcutaneous QHS   metoprolol  succinate  12.5 mg Oral Daily   pantoprazole   40 mg Oral Daily   Continuous Infusions:  heparin  1,050 Units/hr (09/29/23 0400)   PRN Meds: acetaminophen , ondansetron  (ZOFRAN ) IV   Vital Signs    Vitals:   09/28/23 1900 09/28/23 2349 09/29/23 0354 09/29/23 0853  BP: 111/70 (!) 113/48 (!) 110/50 133/71  Pulse: (!) 58 60 74 65  Resp: (!) 24 19 16 16   Temp: 98 F (36.7 C) 97.9 F (36.6 C) 98.4 F (36.9 C) 98.6 F (37 C)  TempSrc: Oral   Oral  SpO2: 99% 96% 98% 99%  Weight:      Height:        Intake/Output Summary (Last 24 hours) at 09/29/2023 0923 Last data filed at 09/29/2023 0400 Gross per 24 hour  Intake 1197.25 ml  Output 300 ml  Net 897.25 ml   Filed Weights   09/27/23 1732  Weight: 98.4 kg    Telemetry    SR - Personally Reviewed  ECG    No new tracings - Personally Reviewed  Physical Exam   GEN: No acute distress.   Neck: No JVD. Cardiac: RRR, no murmurs, rubs, or gallops.  Respiratory: Clear to auscultation bilaterally.  GI: Soft, nontender, non-distended.   MS: No edema; No deformity. Neuro:  Alert and oriented x 3; Nonfocal.  Psych: Normal affect.  Labs    Chemistry Recent Labs  Lab 09/27/23 1736  NA 139  K 3.7  CL 101  CO2 27  GLUCOSE 166*  BUN 25*  CREATININE 1.40*  CALCIUM  8.7*  GFRNONAA 43*  ANIONGAP 11     Hematology Recent Labs  Lab 09/27/23 1736 09/28/23 0150 09/29/23 0447  WBC 8.0 9.7 8.9  RBC 4.47 4.06 4.46  HGB 13.4 12.3 13.3  HCT 39.7 36.6 40.0  MCV 88.8 90.1 89.7  MCH 30.0 30.3 29.8  MCHC  33.8 33.6 33.3  RDW 12.8 12.8 12.9  PLT 260 230 259    Cardiac EnzymesNo results for input(s): TROPONINI in the last 168 hours. No results for input(s): TROPIPOC in the last 168 hours.   BNPNo results for input(s): BNP, PROBNP in the last 168 hours.   DDimer No results for input(s): DDIMER in the last 168 hours.   Radiology    DG Chest 2 View Result Date: 09/27/2023 IMPRESSION: 1. Stable chest, no acute process. Electronically Signed   By: Ozell Daring M.D.   On: 09/27/2023 18:00   Cardiac Studies   CT CORONARY FRACTIONAL FLOW RESERVE FLUID ANALYSIS Result Date: 09/27/2023 IMPRESSION: 1. CT FFR analysis showed hemodynamically significant stenosis in the proximal LAD and proximal-mid RCA. RECOMMENDATIONS: Recommend cardiac catheterization. Guideline directed medical therapy for secondary prevention of CAD. Electronically Signed   By: Soyla Merck M.D.   On: 09/27/2023 13:45   CT CORONARY MORPH W/CTA COR W/SCORE W/CA W/CM &/OR WO/CM Result Date: 09/27/2023 IMPRESSION: 1. 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 CT FFR will be performed and reported separately. 2.  Coronary calcium  score is 248, which places the patient in the 95th percentile for age and sex matched control. 3. Normal coronary origins with right dominance. RECOMMENDATIONS: CAD-RADS 4 Severe stenosis. (70-99% or > 50% left main). Cardiac catheterization or CT FFR is recommended. Consider symptom-guided anti-ischemic pharmacotherapy as well as risk factor modification per guideline directed care. Electronically Signed   By: Soyla Merck M.D.   On: 09/27/2023 13:41   Patient Profile     58 y.o. 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 Tobie.   Assessment & Plan    1. Multivessel  CAD involving the native coronary arteries with unstable angina: - Currently without symptoms of angina or cardiac decompensation overall, chest pain symptoms are fairly atypical, however recent coronary CTA showed severe multivessel CAD - Continue heparin  drip over the weekend - ASA 81 mg - Lipitor 40 mg - N.p.o. at midnight going into 9/22 (timing of cath to be determined, therefore patient will be n.p.o.) - Plan for [facility] on 9/22 - Obtain echo - Further recommendations pending cath results   2. History of syncope: - Occurs in the setting of BM with associated severe abdominal cramping, possibly vasovagal - Found to have significant multivessel CAD on coronary CTA with plans for LHC as above - Monitor on telemetry - Obtain echo   3. PSVT/NSVT: - Quiescent on telemetry - Continue recently started Toprol -XL 12.5 mg   4. HTN: - Blood pressure currently well-controlled - PTA amlodipine  5 mg and Toprol -XL 12.5 mg   5. HLD: - LDL 48 - Lipitor 40 mg in place of pravastatin with multivessel CAD   6. IDDM: - A1c 6.6 - SSI per IM   Informed Consent   Shared Decision Making/Informed Consent{  The risks [stroke (1 in 1000), death (1 in 1000), kidney failure [usually temporary] (1 in 500), bleeding (1 in 200), allergic reaction [possibly serious] (1 in 200)], benefits (diagnostic support and management of coronary artery disease) and alternatives of a cardiac catheterization were discussed in detail with [name] and she is willing to proceed.      For questions or updates, please contact [facility] Please consult [email or web address] for contact info under Cardiology/STEMI.    Signed, Bernardino Bring, PA-C [facility] Pager: [phone] 09/29/2023, 9:23 AM

## 2023-09-30 ENCOUNTER — Encounter: Payer: Self-pay | Admitting: Cardiovascular Disease

## 2023-09-30 ENCOUNTER — Inpatient Hospital Stay (HOSPITAL_COMMUNITY)
Admit: 2023-09-30 | Discharge: 2023-09-30 | Disposition: A | Discharge status: Home / Self Care | Attending: Cardiovascular Disease | Admitting: Cardiovascular Disease

## 2023-09-30 ENCOUNTER — Encounter
Admission: EM | Disposition: A | Discharge status: Home / Self Care | Payer: Self-pay | Source: Home / Self Care | Attending: Internal Medicine

## 2023-09-30 ENCOUNTER — Inpatient Hospital Stay: Admit: 2023-09-30

## 2023-09-30 ENCOUNTER — Other Ambulatory Visit: Payer: Self-pay

## 2023-09-30 ENCOUNTER — Telehealth (HOSPITAL_COMMUNITY): Payer: Self-pay | Admitting: Pharmacy Technician

## 2023-09-30 ENCOUNTER — Other Ambulatory Visit (HOSPITAL_COMMUNITY): Payer: Self-pay

## 2023-09-30 DIAGNOSIS — I200 Unstable angina: Secondary | ICD-10-CM | POA: Diagnosis not present

## 2023-09-30 DIAGNOSIS — R079 Chest pain, unspecified: Secondary | ICD-10-CM

## 2023-09-30 DIAGNOSIS — I25110 Atherosclerotic heart disease of native coronary artery with unstable angina pectoris: Principal | ICD-10-CM

## 2023-09-30 HISTORY — PX: CORONARY STENT INTERVENTION: CATH118234

## 2023-09-30 HISTORY — PX: LEFT HEART CATH AND CORONARY ANGIOGRAPHY: CATH118249

## 2023-09-30 LAB — ECHOCARDIOGRAM COMPLETE
AR max vel: 2.15 cm2
AV Area VTI: 2.68 cm2
AV Area mean vel: 2.18 cm2
AV Mean grad: 2 mmHg
AV Peak grad: 3.9 mmHg
Ao pk vel: 0.99 m/s
Area-P 1/2: 4.08 cm2
Height: 62 in
MV VTI: 1.94 cm2
S' Lateral: 2.4 cm
Weight: 3472 [oz_av]

## 2023-09-30 LAB — BASIC METABOLIC PANEL WITH GFR
Anion gap: 8 (ref 5–15)
BUN: 26 mg/dL — ABNORMAL HIGH (ref 6–20)
CO2: 22 mmol/L (ref 22–32)
Calcium: 8.3 mg/dL — ABNORMAL LOW (ref 8.9–10.3)
Chloride: 103 mmol/L (ref 98–111)
Creatinine, Ser: 1.4 mg/dL — ABNORMAL HIGH (ref 0.44–1.00)
GFR, Estimated: 43 mL/min — ABNORMAL LOW (ref >60)
Glucose, Bld: 204 mg/dL — ABNORMAL HIGH (ref 70–99)
Potassium: 3.7 mmol/L (ref 3.5–5.1)
Sodium: 133 mmol/L — ABNORMAL LOW (ref 135–145)

## 2023-09-30 LAB — CBC
HCT: 37.5 % (ref 36.0–46.0)
Hemoglobin: 12.9 g/dL (ref 12.0–15.0)
MCH: 30.7 pg (ref 26.0–34.0)
MCHC: 34.4 g/dL (ref 30.0–36.0)
MCV: 89.3 fL (ref 80.0–100.0)
Platelets: 236 K/uL (ref 150–400)
RBC: 4.2 MIL/uL (ref 3.87–5.11)
RDW: 12.8 % (ref 11.5–15.5)
WBC: 10.5 K/uL (ref 4.0–10.5)
nRBC: 0 % (ref 0.0–0.2)

## 2023-09-30 LAB — POCT ACTIVATED CLOTTING TIME
Activated Clotting Time: 320 s
Activated Clotting Time: 429 s

## 2023-09-30 LAB — GLUCOSE, CAPILLARY
Glucose-Capillary: 170 mg/dL — ABNORMAL HIGH (ref 70–99)
Glucose-Capillary: 231 mg/dL — ABNORMAL HIGH (ref 70–99)

## 2023-09-30 LAB — HEPARIN LEVEL (UNFRACTIONATED): Heparin Unfractionated: 0.29 [IU]/mL — ABNORMAL LOW (ref 0.30–0.70)

## 2023-09-30 SURGERY — LEFT HEART CATH AND CORONARY ANGIOGRAPHY
Anesthesia: Moderate Sedation

## 2023-09-30 MED ORDER — SODIUM CHLORIDE 0.9 % WEIGHT BASED INFUSION: 1.0000 mL/kg/h | INTRAVENOUS | Status: DC

## 2023-09-30 MED ORDER — VERAPAMIL HCL 2.5 MG/ML IV SOLN
INTRAVENOUS | Status: AC
Start: 2023-09-30 — End: 2023-09-30
  Filled 2023-09-30: qty 2

## 2023-09-30 MED ORDER — SODIUM CHLORIDE 0.9 % IV SOLN: 250.0000 mL | INTRAVENOUS | Status: DC | PRN

## 2023-09-30 MED ORDER — TICAGRELOR 90 MG PO TABS
90.0000 mg | ORAL_TABLET | Freq: Two times a day (BID) | ORAL | 0 refills | Status: AC
  Filled 2023-09-30: qty 60, 30d supply, fill #0

## 2023-09-30 MED ORDER — MIDAZOLAM HCL 2 MG/2ML IJ SOLN
INTRAMUSCULAR | Status: AC
  Filled 2023-09-30: qty 2

## 2023-09-30 MED ORDER — IOHEXOL 300 MG/ML  SOLN
INTRAMUSCULAR | Status: DC | PRN
  Administered 2023-09-30: 120 mL

## 2023-09-30 MED ORDER — ASPIRIN 81 MG PO TBEC: 81.0000 mg | DELAYED_RELEASE_TABLET | Freq: Every day | ORAL | Status: DC

## 2023-09-30 MED ORDER — HEPARIN SODIUM (PORCINE) 1000 UNIT/ML IJ SOLN
INTRAMUSCULAR | Status: DC | PRN
  Administered 2023-09-30 (×2): 5000 [IU] via INTRAVENOUS

## 2023-09-30 MED ORDER — NITROGLYCERIN 1 MG/10 ML FOR IR/CATH LAB
INTRA_ARTERIAL | Status: AC
  Filled 2023-09-30: qty 10

## 2023-09-30 MED ORDER — ACETAMINOPHEN 325 MG PO TABS
ORAL_TABLET | ORAL | Status: AC
  Administered 2023-09-30: 650 mg via ORAL
  Filled 2023-09-30: qty 2

## 2023-09-30 MED ORDER — FENTANYL CITRATE (PF) 100 MCG/2ML IJ SOLN
INTRAMUSCULAR | Status: AC
  Filled 2023-09-30: qty 2

## 2023-09-30 MED ORDER — FREE WATER
500.0000 mL | Freq: Once | Status: AC
  Administered 2023-09-30: 500 mL via ORAL

## 2023-09-30 MED ORDER — HEPARIN SODIUM (PORCINE) 1000 UNIT/ML IJ SOLN
INTRAMUSCULAR | Status: AC
  Filled 2023-09-30: qty 10

## 2023-09-30 MED ORDER — TICAGRELOR 90 MG PO TABS
ORAL_TABLET | ORAL | Status: DC | PRN
  Administered 2023-09-30: 180 mg via ORAL

## 2023-09-30 MED ORDER — HEPARIN (PORCINE) IN NACL 1000-0.9 UT/500ML-% IV SOLN
INTRAVENOUS | Status: DC | PRN
  Administered 2023-09-30: 1000 mL

## 2023-09-30 MED ORDER — ASPIRIN 81 MG PO TBEC
81.0000 mg | DELAYED_RELEASE_TABLET | Freq: Every day | ORAL | 0 refills | Status: AC
  Filled 2023-09-30: qty 30, 30d supply, fill #0

## 2023-09-30 MED ORDER — VERAPAMIL HCL 2.5 MG/ML IV SOLN
INTRAVENOUS | Status: DC | PRN
  Administered 2023-09-30: 2.5 mg via INTRA_ARTERIAL

## 2023-09-30 MED ORDER — ASPIRIN 81 MG PO CHEW: 81.0000 mg | CHEWABLE_TABLET | ORAL | Status: DC

## 2023-09-30 MED ORDER — SODIUM CHLORIDE 0.9 % WEIGHT BASED INFUSION
3.0000 mL/kg/h | INTRAVENOUS | Status: DC
  Administered 2023-09-30: 3 mL/kg/h via INTRAVENOUS

## 2023-09-30 MED ORDER — LIDOCAINE HCL (PF) 1 % IJ SOLN
INTRAMUSCULAR | Status: DC | PRN
  Administered 2023-09-30: 2 mL

## 2023-09-30 MED ORDER — TICAGRELOR 90 MG PO TABS
90.0000 mg | ORAL_TABLET | Freq: Two times a day (BID) | ORAL | Status: DC
Start: 2023-09-30 — End: 2023-09-30

## 2023-09-30 MED ORDER — TICAGRELOR 90 MG PO TABS
ORAL_TABLET | ORAL | Status: AC
  Filled 2023-09-30: qty 2

## 2023-09-30 MED ORDER — SODIUM CHLORIDE 0.9% FLUSH: 3.0000 mL | INTRAVENOUS | Status: DC | PRN

## 2023-09-30 MED ORDER — NITROGLYCERIN 1 MG/10 ML FOR IR/CATH LAB
INTRA_ARTERIAL | Status: DC | PRN
  Administered 2023-09-30 (×2): 200 ug via INTRACORONARY

## 2023-09-30 MED ORDER — ASPIRIN 81 MG PO CHEW
81.0000 mg | CHEWABLE_TABLET | Freq: Once | ORAL | Status: AC
Start: 2023-09-30 — End: 2023-09-30
  Administered 2023-09-30: 81 mg via ORAL
  Filled 2023-09-30: qty 1

## 2023-09-30 MED ORDER — SODIUM CHLORIDE 0.9% FLUSH
3.0000 mL | Freq: Two times a day (BID) | INTRAVENOUS | Status: DC
  Administered 2023-09-30: 3 mL via INTRAVENOUS

## 2023-09-30 MED ORDER — FENTANYL CITRATE (PF) 100 MCG/2ML IJ SOLN
INTRAMUSCULAR | Status: DC | PRN
  Administered 2023-09-30: 25 ug via INTRAVENOUS

## 2023-09-30 MED ORDER — METOPROLOL SUCCINATE ER 25 MG PO TB24
12.5000 mg | ORAL_TABLET | Freq: Every day | ORAL | 0 refills | Status: DC
  Filled 2023-09-30: qty 15, 30d supply, fill #0

## 2023-09-30 MED ORDER — LIDOCAINE HCL 1 % IJ SOLN
INTRAMUSCULAR | Status: AC
  Filled 2023-09-30: qty 20

## 2023-09-30 MED ORDER — MIDAZOLAM HCL 2 MG/2ML IJ SOLN
INTRAMUSCULAR | Status: DC | PRN
  Administered 2023-09-30 (×3): 1 mg via INTRAVENOUS

## 2023-09-30 SURGICAL SUPPLY — 19 items
BALLOON SCOREFLEX 2.50X15 (BALLOONS) IMPLANT
BALLOON TREK RX 2.5X12 (BALLOONS) IMPLANT
BALLOON ~~LOC~~ EUPHORA RX 2.75X15 (BALLOONS) IMPLANT
CATH INFINITI 5 FR JL3.5 (CATHETERS) IMPLANT
CATH INFINITI AMBI 5FR JK (CATHETERS) IMPLANT
CATH LAUNCHER 6FR JR4 (CATHETERS) IMPLANT
DEVICE RAD TR BAND REGULAR (VASCULAR PRODUCTS) IMPLANT
DRAPE BRACHIAL (DRAPES) IMPLANT
GLIDESHEATH SLEND SS 6F .021 (SHEATH) IMPLANT
GUIDEWIRE INQWIRE 1.5J.035X260 (WIRE) IMPLANT
KIT ENCORE 26 ADVANTAGE (KITS) IMPLANT
KIT SYRINGE INJ CVI SPIKEX1 (MISCELLANEOUS) IMPLANT
PACK CARDIAC CATH (CUSTOM PROCEDURE TRAY) ×1 IMPLANT
SET ATX-X65L (MISCELLANEOUS) IMPLANT
STATION PROTECTION PRESSURIZED (MISCELLANEOUS) IMPLANT
STENT ONYX FRONTIER 2.5X22 (Permanent Stent) IMPLANT
TUBING CIL FLEX 10 FLL-RA (TUBING) IMPLANT
WIRE RUNTHROUGH .014X180CM (WIRE) IMPLANT
WIRE RUNTHROUGH IZANAI 014 180 (WIRE) IMPLANT

## 2023-09-30 NOTE — Progress Notes (Signed)
*  PRELIMINARY RESULTS* Echocardiogram 2D Echocardiogram has been performed.  Jennifer Lozano 09/30/2023, 1:56 PM

## 2023-09-30 NOTE — Plan of Care (Signed)

## 2023-09-30 NOTE — Care Management Important Message (Signed)
 Important Message  Patient Details  Name: Jennifer Lozano MRN: [id number] Date of Birth: Jun 19, 1965   Important Message Given:  Yes - Medicare IM     [name] 09/30/2023, 5:01 PM

## 2023-09-30 NOTE — Discharge Summary (Signed)
 Physician Discharge Summary  Patient: Jennifer Lozano FMW:[id number] DOB: 03/14/1965   Code Status: Full Code Admit date: 09/27/2023 Discharge date: 09/30/2023 Disposition: Home, No home health services recommended PCP: Buren Rock HERO, MD  Recommendations for Outpatient Follow-up:  Follow up with PCP within 1-2 weeks Regarding general hospital follow up and preventative care Recommend CBC, BMP, follow up on the bruising from her cath and IV Follow up with cardiology   Discharge Diagnoses:  Principal Problem:   Unstable angina (HCC) Active Problems:   Syncope   Controlled type 2 diabetes mellitus without complication, without long-term current use of insulin  (HCC)   Diabetic polyneuropathy associated with type 2 diabetes mellitus (HCC)   Hyperlipidemia   Hypertension   Hypothyroidism, unspecified   Morbid obesity (HCC)   Coronary artery disease of native artery of native heart with stable angina pectoris [facility])  Brief Hospital Course Summary: Jennifer Lozano is a 58 y.o. female with a PMH significant for type 2 diabetes, asthma, essential hypertension, hypothyroidism, hyperlipidemia, morbid obesity, who presented to the ER with complaint of chest pain. Patient had CT coronaries that showed significant narrowing.  Cardiology was consulted and planned cardiac cath 9/22. She remained on a heparin  gtt until that time and her chest pain had resolved.   Cath results and cardiology follow up recommendations are as follows: 1.  Significant two-vessel coronary artery disease. 2.  Normal LV systolic function mildly elevated left ventricular end-diastolic pressure at 18 mmHg. 3.  Successful complex bifurcation angioplasty and drug-eluting stent placement using provisional stenting technique.  The right PDA was ballooned without stent placement.  The stent at the distal RCA extended into the right AV groove.   Recommendations: Dual antiplatelet therapy for at least 6 months. Aggressive treatment  of risk factors. Will consider staged LAD PCI as an outpatient but might have to consider alternative access given severe right arm discomfort and difficulty torquing the catheter due to small radial artery size and suspected spasm.  She was prescribed aspirin  and brilinta . No chest pain with exertion.   All other chronic conditions were treated with home medications.    Discharge Condition: Good, improved Recommended discharge diet: Regular healthy diet  Consultations: Cardiology   Procedures/Studies: Cardiac cath  Discharge Instructions     AMB Referral to Cardiac Rehabilitation - Phase II   Complete by: As directed    Diagnosis: Coronary Stents   After initial evaluation and assessments completed: Virtual Based Care may be provided alone or in conjunction with Phase 2 Cardiac Rehab based on patient barriers.: Yes   Intensive Cardiac Rehabilitation (ICR) [facility] location only OR Traditional Cardiac Rehabilitation (TCR) *If criteria for ICR are not met will enroll in TCR ([facility] only): Yes      Allergies as of 09/30/2023       Reactions   Morphine And Codeine Palpitations        Medication List     STOP taking these medications    azithromycin  250 MG tablet Commonly known as: Zithromax  Z-Pak   doxycycline 100 MG tablet Commonly known as: VIBRA-TABS   famotidine  20 MG tablet Commonly known as: PEPCID    HYDROcodone -acetaminophen  5-325 MG tablet Commonly known as: Norco   ibuprofen 200 MG tablet Commonly known as: ADVIL   metoprolol  tartrate 50 MG tablet Commonly known as: LOPRESSOR    pravastatin 40 MG tablet Commonly known as: PRAVACHOL   predniSONE  10 MG tablet Commonly known as: DELTASONE        TAKE these medications    albuterol 108 (  90 Base) MCG/ACT inhaler Commonly known as: VENTOLIN HFA Inhale 2 puffs into the lungs.   amLODipine  5 MG tablet Commonly known as: NORVASC  Take 5 mg by mouth daily.   aspirin  EC 81 MG tablet Take 1 tablet (81 mg  total) by mouth daily. Swallow whole. Start taking on: October 01, 2023   atorvastatin  40 MG tablet Commonly known as: LIPITOR Take 40 mg by mouth daily.   Dexcom G7 Sensor Misc 1 each.   Lantus SoloStar 100 UNIT/ML Solostar Pen Generic drug: insulin  glargine Inject 40 Units into the skin daily.   levothyroxine 125 MCG tablet Commonly known as: SYNTHROID Take 125 mcg by mouth daily before breakfast.   lisinopril-hydrochlorothiazide 20-25 MG tablet Commonly known as: ZESTORETIC Take 1 tablet by mouth daily.   metoprolol  succinate 25 MG 24 hr tablet Commonly known as: TOPROL -XL Take 0.5 tablets (12.5 mg total) by mouth daily. Take with or immediately following a meal.   NovoLOG  100 UNIT/ML injection Generic drug: insulin  aspart Inject into the skin continuous. Adjust insulin  pump according to carb intake   ondansetron  4 MG disintegrating tablet Commonly known as: ZOFRAN -ODT Take 1 tablet (4 mg total) by mouth every 8 (eight) hours as needed for nausea or vomiting.   pantoprazole  40 MG tablet Commonly known as: PROTONIX  Take 40 mg by mouth daily.   pregabalin 75 MG capsule Commonly known as: LYRICA Take 75 mg by mouth daily.   ticagrelor  90 MG Tabs tablet Commonly known as: BRILINTA  Take 1 tablet (90 mg total) by mouth 2 (two) times daily.   Trulicity 4.5 MG/0.5ML Soaj Generic drug: Dulaglutide Inject 4.5 mg into the skin once a week.        Follow-up Information     Buren Rock HERO, MD. Schedule an appointment as soon as possible for a visit in 1 week(s).   Specialty: Family Medicine Contact information: [address] [place] [address] [phone]         Darron Deatrice LABOR, MD. Go in 1 week(s).   Specialty: Cardiology Contact information: [address] [place] [address] [phone]                 Subjective   Pt reports feeling well. She has no chest pain at rest or when walking in her room. She has some  tenderness at the site of the cath without bleeding.   All questions and concerns were addressed at time of discharge.  Objective  Blood pressure (!) 133/57, pulse 65, temperature 98 F (36.7 C), resp. rate 18, height 5' 2 (1.575 m), weight 98.4 kg, SpO2 100%.   General: Pt is alert, awake, not in acute distress Cardiovascular: RRR, S1/S2 +, no rubs, no gallops Respiratory: CTA bilaterally, no wheezing, no rhonchi Abdominal: Soft, NT, ND, bowel sounds + Extremities: mild ecchymosis ar R wrist without bleeding. Mild erythema at R elbow joint from prior IV site which is smaller than the drawn outline  The results of significant diagnostics from this hospitalization (including imaging, microbiology, ancillary and laboratory) are listed below for reference.   Imaging studies: ECHOCARDIOGRAM COMPLETE Result Date: 09/30/2023    ECHOCARDIOGRAM REPORT   Patient Name:   Jennifer Lozano Date of Exam: 09/30/2023 Medical Rec #:  [id number]      Height:       62.0 in Accession #:    [id number]     Weight:       217.0 lb Date of Birth:  07-20-1965  BSA:          1.979 m Patient Age:    58 years       BP:           138/66 mmHg Patient Gender: F              HR:           62 bpm. Exam Location:  [facility] Procedure: 2D Echo, Cardiac Doppler and Color Doppler (Both Spectral and Color            Flow Doppler were utilized during procedure). Indications:     Chest pain R07.9  History:         Patient has no prior history of Echocardiogram examinations.                  Risk Factors:Diabetes and Hypertension.  Sonographer:     Christopher Furnace Referring Phys:  [id number] FLYJFFJI A ARIDA Diagnosing Phys: Deatrice Cage MD  Sonographer Comments: Suboptimal apical window and no subcostal window. IMPRESSIONS  1. Left ventricular ejection fraction, by estimation, is 55 to 60%. The left ventricle has normal function. The left ventricle has no regional wall motion abnormalities. Left ventricular diastolic parameters were normal.  2. Right  ventricular systolic function is normal. The right ventricular size is normal. Tricuspid regurgitation signal is inadequate for assessing PA pressure.  3. The mitral valve is normal in structure. No evidence of mitral valve regurgitation. No evidence of mitral stenosis.  4. The aortic valve is normal in structure. Aortic valve regurgitation is not visualized. Aortic valve sclerosis is present, with no evidence of aortic valve stenosis. FINDINGS  Left Ventricle: Left ventricular ejection fraction, by estimation, is 55 to 60%. The left ventricle has normal function. The left ventricle has no regional wall motion abnormalities. The left ventricular internal cavity size was normal in size. There is  no left ventricular hypertrophy. Left ventricular diastolic parameters were normal. Right Ventricle: The right ventricular size is normal. No increase in right ventricular wall thickness. Right ventricular systolic function is normal. Tricuspid regurgitation signal is inadequate for assessing PA pressure. Left Atrium: Left atrial size was normal in size. Right Atrium: Right atrial size was normal in size. Pericardium: There is no evidence of pericardial effusion. Mitral Valve: The mitral valve is normal in structure. No evidence of mitral valve regurgitation. No evidence of mitral valve stenosis. MV peak gradient, 4.8 mmHg. The mean mitral valve gradient is 2.0 mmHg. Tricuspid Valve: The tricuspid valve is normal in structure. Tricuspid valve regurgitation is not demonstrated. No evidence of tricuspid stenosis. Aortic Valve: The aortic valve is normal in structure. Aortic valve regurgitation is not visualized. Aortic valve sclerosis is present, with no evidence of aortic valve stenosis. Aortic valve mean gradient measures 2.0 mmHg. Aortic valve peak gradient measures 3.9 mmHg. Aortic valve area, by VTI measures 2.68 cm. Pulmonic Valve: The pulmonic valve was normal in structure. Pulmonic valve regurgitation is not  visualized. No evidence of pulmonic stenosis. Aorta: The aortic root is normal in size and structure. Venous: The inferior vena cava was not well visualized. IAS/Shunts: No atrial level shunt detected by color flow Doppler.  LEFT VENTRICLE PLAX 2D LVIDd:         3.40 cm   Diastology LVIDs:         2.40 cm   LV e' medial:    7.62 cm/s LV PW:         1.00 cm   LV E/e' medial:  9.8 LV IVS:        0.90 cm   LV e' lateral:   8.05 cm/s LVOT diam:     2.00 cm   LV E/e' lateral: 9.2 LV SV:         55 LV SV Index:   28 LVOT Area:     3.14 cm  RIGHT VENTRICLE RV Basal diam:  3.50 cm RV Mid diam:    3.30 cm RV S prime:     9.57 cm/s LEFT ATRIUM             Index        RIGHT ATRIUM           Index LA diam:        3.10 cm 1.57 cm/m   RA Area:     10.50 cm LA Vol (A2C):   23.0 ml 11.62 ml/m  RA Volume:   23.20 ml  11.72 ml/m LA Vol (A4C):   25.8 ml 13.04 ml/m LA Biplane Vol: 24.4 ml 12.33 ml/m  AORTIC VALVE AV Area (Vmax):    2.15 cm AV Area (Vmean):   2.18 cm AV Area (VTI):     2.68 cm AV Vmax:           99.15 cm/s AV Vmean:          65.150 cm/s AV VTI:            0.207 m AV Peak Grad:      3.9 mmHg AV Mean Grad:      2.0 mmHg LVOT Vmax:         67.90 cm/s LVOT Vmean:        45.200 cm/s LVOT VTI:          0.176 m LVOT/AV VTI ratio: 0.85  AORTA Ao Root diam: 2.90 cm MITRAL VALVE               TRICUSPID VALVE MV Area (PHT): 4.08 cm    TR Peak grad:   7.1 mmHg MV Area VTI:   1.94 cm    TR Vmax:        133.00 cm/s MV Peak grad:  4.8 mmHg MV Mean grad:  2.0 mmHg    SHUNTS MV Vmax:       1.10 m/s    Systemic VTI:  0.18 m MV Vmean:      58.4 cm/s   Systemic Diam: 2.00 cm MV Decel Time: 186 msec MV E velocity: 74.40 cm/s MV A velocity: 80.70 cm/s MV E/A ratio:  0.92 Deatrice Cage MD Electronically signed by Deatrice Cage MD Signature Date/Time: 09/30/2023/2:10:59 PM    Final    CARDIAC CATHETERIZATION Result Date: 09/30/2023   Dist RCA lesion is 60% stenosed with 95% stenosed side branch in RPAV.   Mid LAD lesion is 70%  stenosed.   Prox LAD lesion is 30% stenosed.   RPDA lesion is 90% stenosed.   Prox RCA lesion is 40% stenosed.   A stent was successfully placed.   A drug-eluting stent was successfully placed using a STENT ONYX FRONTIER 2.5X22 in the main branch and side branch.   in the main branch and side branch.   in the main branch and side branch.   Scoring balloon angioplasty was performed using a BALLOON SCOREFLEX 2.50X15.   Angioplasty was performed in the main branch and side branch. .   Angioplasty was performed in the main branch and side branch. .   Angioplasty was performed in  the main branch and side branch. .   Post intervention, there is a 30% residual stenosis.   Post intervention, there is a 0% residual stenosis.   Post intervention, the side branch was reduced to 0% residual stenosis.   The left ventricular systolic function is normal.   LV end diastolic pressure is mildly elevated.   The left ventricular ejection fraction is 55-65% by visual estimate.   Recommend uninterrupted dual antiplatelet therapy with Aspirin  81mg  daily and Ticagrelor  90mg  twice daily for a minimum of 6 months (stable ischemic heart disease-Class I recommendation). 1.  Significant two-vessel coronary artery disease. 2.  Normal LV systolic function mildly elevated left ventricular end-diastolic pressure at 18 mmHg. 3.  Successful complex bifurcation angioplasty and drug-eluting stent placement using provisional stenting technique.  The right PDA was ballooned without stent placement.  The stent at the distal RCA extended into the right AV groove. Recommendations: Dual antiplatelet therapy for at least 6 months. Aggressive treatment of risk factors. Will consider staged LAD PCI as an outpatient but might have to consider alternative access given severe right arm discomfort and difficulty torquing the catheter due to small radial artery size and suspected spasm.   DG Chest 2 View Result Date: 09/27/2023 CLINICAL DATA:  Chest pain,  presyncope, coronary artery disease EXAM: CHEST - 2 VIEW COMPARISON:  09/27/2023, 07/24/2019 FINDINGS: Frontal and lateral views of the chest demonstrate an unremarkable cardiac silhouette. Chronic bilateral upper lobe scarring. No acute airspace disease, effusion, or pneumothorax. No acute bony abnormalities. IMPRESSION: 1. Stable chest, no acute process. Electronically Signed   By: Ozell Daring M.D.   On: 09/27/2023 18:00   CT CORONARY FRACTIONAL FLOW RESERVE FLUID ANALYSIS Result Date: 09/27/2023 EXAM: CT FFR analysis was performed on the original cardiac CTA dataset. Diagrammatic representation of the CT FFR analysis is provided in a separate PDF document in PACS. This dictation was created using the PDF document and an interactive 3D model of the results. The 3D model is not available in the EMR/PACS. INTERPRETATION: CT FFR provides simultaneous calculation of pressure and flow across the entire coronary tree. For clinical decision making, CT FFR values should be obtained 1-2 cm distal to the lower border of each stenosis measured. Coronary CTA-related artifacts may impair the diagnostic accuracy of the original cardiac CTA and FFR CT results. *Due to the fact that CT FFR represents a mathematically-derived analysis, it is recommended that the results be interpreted as follows: 1. CT FFR >0.80: Low likelihood of hemodynamic significance. 2. CT FFR 0.76-0.80: Borderline likelihood of hemodynamic significance. 3. CT FFR =< 0.75: High likelihood of hemodynamic significance. *Coronary CT Angiography-derived Fractional Flow Reserve Testing in Patients with Stable Coronary Artery Disease: Recommendations on Interpretation and Reporting. Radiology: Cardiothoracic Imaging. 2019;1(5):e190050 FINDINGS: 1. Left Main: Low likelihood of hemodynamic significance. 2. LAD: High likelihood of hemodynamic significance. There are two serial lesions, the first LAD lesions has FFR 0.78 but delta FFR of 0.20. There is then a  second prox LAD lesion with FFr 0.62 and delta FFR 0.15. 3. LCX: Low likelihood of hemodynamic significance. 4. RCA: High likelihood of hemodynamic significance, FFR of prox-mid RCA is 0.70. PDA not mapped. IMPRESSION: 1. CT FFR analysis showed hemodynamically significant stenosis in the proximal LAD and proximal-mid RCA. RECOMMENDATIONS: Recommend cardiac catheterization. Guideline directed medical therapy for secondary prevention of CAD. Electronically Signed   By: Soyla Merck M.D.   On: 09/27/2023 13:45   CT CORONARY MORPH W/CTA COR W/SCORE W/CA W/CM &/OR WO/CM Result  Date: 09/27/2023 CLINICAL DATA:  Chest pain, nonspecific ECG abnormal, high CAD risk EXAM: Cardiac/Coronary CTA TECHNIQUE: A non-contrast, gated CT scan was obtained with axial slices of 2.5 mm through the heart for calcium  scoring. Calcium  scoring was performed using the Agatston method. A 120 kV prospective, gated, contrast cardiac CT scan was obtained. Gantry rotation speed was 230 msec and collimation was 0.63 mm. Two sublingual nitroglycerin  tablets (0.8 mg) were given. The 3D data set was reconstructed with motion correction for the best systolic or diastolic phase. Images were analyzed on a dedicated workstation using MPR, MIP, and VRT modes. The patient received 95 cc of contrast. FINDINGS: Image quality: Average Noise artifact is: Moderately reduced SNR and CNR Coronary calcium  score is 248, which places the patient in the 95th percentile for age and sex matched control. Coronary arteries: Normal coronary origins.  Right dominance. Right Coronary Artery: Severe plaque proximal-mid RCA, 70-99% stenosis. Distal vessels not optimally seen but concern for severe stenosis in the proximal PDA, 70-99% stenosis. Left Main Coronary Artery: Mild plaque distal LM, 25-49% stenosis, challenging to visualize due to artifact. Left Anterior Descending Coronary Artery: Severe plaque proximal LAD, 70-99% stenosis, two serial lesions. Moderate mid LAD  plaque, 50-69% stenosis. Diagonals difficult to assess due to image quality. Left Circumflex Artery: Moderate diffuse disease, nondominant. Aorta: Normal size, 29 mm at the mid ascending aorta (level of the PA bifurcation) measured double oblique. Aortic Valve: No calcifications. Other findings: Normal pulmonary vein drainage into the left atrium. Normal left atrial appendage without thrombus. Normal size of the pulmonary artery. Please see separate report from [facility] for non-cardiac findings. Total plaque volume not performed. IMPRESSION: 1. 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 CT FFR will be performed and reported separately. 2. Coronary calcium  score is 248, which places the patient in the 95th percentile for age and sex matched control. 3. Normal coronary origins with right dominance. RECOMMENDATIONS: CAD-RADS 4 Severe stenosis. (70-99% or > 50% left main). Cardiac catheterization or CT FFR is recommended. Consider symptom-guided anti-ischemic pharmacotherapy as well as risk factor modification per guideline directed care. Electronically Signed   By: Soyla Merck M.D.   On: 09/27/2023 13:41   LONG TERM MONITOR (3-14 DAYS) Result Date: 09/20/2023 Patch Wear Time:  13 days and 22 hours (2025-08-20T19:59:27-0400 to 2025-09-03T18:00:24-0400) Patient had a min HR of 51 bpm, max HR of 211 bpm, and avg HR of 69 bpm. Predominant underlying rhythm was Sinus Rhythm. 1 run of Ventricular Tachycardia occurred lasting 12 beats with a max rate of 162 bpm (avg 144 bpm). 47 Supraventricular Tachycardia runs occurred, the run with the fastest interval lasting 7 beats with a max rate of 211 bpm, the longest lasting 16.2 secs with an avg rate of 131 bpm. Isolated SVEs were rare (<1.0%), SVE Couplets were rare (<1.0%), and SVE Triplets were rare (<1.0%). Isolated VEs were rare (<1.0%), and no VE Couplets or VE Triplets were present. Ventricular  Trigeminy was present. Conclusion Average heart rate 69, range 51-2 11. Paroxysmal atrial fibrillation noted ( strip 7, episodes of A-fib was incorrectly labeled as SVT). 1 run of nonsustained VT lasting 12 beats. Episodes of nonsustained SVT, longest lasting 16 seconds.    Labs: Basic Metabolic Panel: Recent Labs  Lab 09/27/23 1736 09/30/23 0438  NA 139 133*  K 3.7 3.7  CL 101 103  CO2 27 22  GLUCOSE 166* 204*  BUN 25* 26*  CREATININE 1.40* 1.40*  CALCIUM  8.7* 8.3*   CBC: Recent Labs  Lab 09/27/23 1736 09/28/23 0150 09/29/23 0447 09/30/23 0438  WBC 8.0 9.7 8.9 10.5  HGB 13.4 12.3 13.3 12.9  HCT 39.7 36.6 40.0 37.5  MCV 88.8 90.1 89.7 89.3  PLT 260 230 259 236   Microbiology: Results for orders placed or performed during the hospital encounter of 10/31/18  SARS CORONAVIRUS 2 (TAT 6-24 HRS) Nasopharyngeal Nasopharyngeal Swab     Status: None   Collection Time: 10/31/18  7:58 AM   Specimen: Nasopharyngeal Swab  Result Value Ref Range Status   SARS Coronavirus 2 NEGATIVE NEGATIVE Final    Comment: (NOTE) SARS-CoV-2 target nucleic acids are NOT DETECTED. The SARS-CoV-2 RNA is generally detectable in upper and lower respiratory specimens during the acute phase of infection. Negative results do not preclude SARS-CoV-2 infection, do not rule out co-infections with other pathogens, and should not be used as the sole basis for treatment or other patient management decisions. Negative results must be combined with clinical observations, patient history, and epidemiological information. The expected result is Negative. Fact Sheet for Patients: HairSlick.no Fact Sheet for Healthcare Providers: quierodirigir.com This test is not yet approved or cleared by the [place]  FDA and  has been authorized for detection and/or diagnosis of SARS-CoV-2 by FDA under an Emergency Use Authorization (EUA). This EUA will remain  in  effect (meaning this test can be used) for the duration of the COVID-19 declaration under Section 56 4(b)(1) of the Act, 21 U.S.C. section 360bbb-3(b)(1), unless the authorization is terminated or revoked sooner. Performed at [facility], [address], [place], [place] [address]     Time coordinating discharge: Over 30 minutes  Marien LITTIE Piety, MD  [facility] 09/30/2023, 3:42 PM

## 2023-09-30 NOTE — Interval H&P Note (Signed)
 History and Physical Interval Note:  09/30/2023 8:49 AM  Jennifer Lozano  has presented today for surgery, with the diagnosis of Multivessel coronary artery disease with unstable angina.  The various methods of treatment have been discussed with the patient and family. After consideration of risks, benefits and other options for treatment, the patient has consented to  Procedure(s): LEFT HEART CATH AND CORONARY ANGIOGRAPHY (N/A) as a surgical intervention.  The patient's history has been reviewed, patient examined, no change in status, stable for surgery.  I have reviewed the patient's chart and labs.  Questions were answered to the patient's satisfaction.     Onya Eutsler

## 2023-09-30 NOTE — Telephone Encounter (Signed)
 Patient Product/process development scientist completed.    The patient is insured through [facility]. Patient has Medicare and is not eligible for a copay card, but may be able to apply for patient assistance or Medicare RX Payment Plan (Patient Must reach out to their plan, if eligible for payment plan), if available.    Ran test claim for Brilinta  90 mg and the current 30 day co-pay is $1.60.   This test claim was processed through [facility]- copay amounts may vary at other pharmacies due to pharmacy/plan contracts, or as the patient moves through the different stages of their insurance plan.     Reyes Sharps, CPHT Pharmacy Technician III Certified Patient Advocate [facility] Patient Advocate Team Direct Number: [phone]  Fax: [phone]

## 2023-09-30 NOTE — Discharge Instructions (Addendum)
 I'm glad you are feeling better. Keep your wrist and elbow where you have bruising, swelling clean and elevated to help resolve the swelling.  Please take your new medications daily- (aspirin  and brilinta ) to help prevent your stent from clotting.  Your metoprolol  medication dose has decreased.  Follow up with dr Darron outpatient.

## 2023-09-30 NOTE — Consult Note (Signed)
 Pharmacy Consult Note - Anticoagulation  Pharmacy Consult for Heparin  infusion Indication: chest pain/ACS  Allergies  Allergen Reactions   Morphine And Codeine Palpitations    PATIENT MEASUREMENTS: Height: 5' 2 (157.5 cm) Weight: 98.4 kg (217 lb) IBW/kg (Calculated) : 50.1 HEPARIN  DW (KG): 73.4  VITAL SIGNS: Temp: 97.7 F (36.5 C) (09/22 0454) Temp Source: Oral (09/22 0454) BP: 117/54 (09/22 0454) Pulse Rate: 66 (09/22 0454)  Recent Labs    09/27/23 1856 09/27/23 2229 09/28/23 0446 09/28/23 1257 09/30/23 0438  HGB  --    < >  --    < > 12.9  HCT  --    < >  --    < > 37.5  PLT  --    < >  --    < > 236  APTT 32  --   --   --   --   LABPROT 13.6  --   --   --   --   INR 1.0  --   --   --   --   HEPARINUNFRC  --    < > 0.51   < > 0.29*  CREATININE  --   --   --   --  1.40*  TROPONINIHS 4   < > 4  --   --    < > = values in this interval not displayed.    Estimated Creatinine Clearance: 46.8 mL/min (A) (by C-G formula based on SCr of 1.4 mg/dL (H)).  PAST MEDICAL HISTORY: Past Medical History:  Diagnosis Date   Asthma    COVID-19 07/15/2019   diagnosed in outpatient clinic on 07/15/2019   Diabetes mellitus without complication (HCC)    History of hiatal hernia    Hypertension    PONV (postoperative nausea and vomiting)     Medications:  Medications Prior to Admission  Medication Sig Dispense Refill Last Dose/Taking   albuterol (VENTOLIN HFA) 108 (90 Base) MCG/ACT inhaler Inhale 2 puffs into the lungs.   Unknown   amLODipine  (NORVASC ) 5 MG tablet Take 5 mg by mouth daily.   09/27/2023 Morning   atorvastatin  (LIPITOR) 40 MG tablet Take 40 mg by mouth daily.   09/26/2023 Evening   LANTUS SOLOSTAR 100 UNIT/ML Solostar Pen Inject 40 Units into the skin daily.   09/26/2023 Evening   levothyroxine (SYNTHROID, LEVOTHROID) 125 MCG tablet Take 125 mcg by mouth daily before breakfast.   1 09/27/2023 Morning   lisinopril-hydrochlorothiazide (PRINZIDE,ZESTORETIC) 20-25 MG  tablet Take 1 tablet by mouth daily.  4 09/27/2023 Morning   metoprolol  succinate (TOPROL -XL) 25 MG 24 hr tablet Take 0.5 tablets (12.5 mg total) by mouth daily. Take with or immediately following a meal. 45 tablet 3 09/26/2023   NOVOLOG  100 UNIT/ML injection Inject into the skin continuous. Adjust insulin  pump according to carb intake  2 09/26/2023 Evening   ondansetron  (ZOFRAN -ODT) 4 MG disintegrating tablet Take 1 tablet (4 mg total) by mouth every 8 (eight) hours as needed for nausea or vomiting. 20 tablet 0 Unknown   pantoprazole  (PROTONIX ) 40 MG tablet Take 40 mg by mouth daily.   09/27/2023 Morning   pravastatin (PRAVACHOL) 40 MG tablet Take 40 mg by mouth daily.   09/27/2023 Morning   pregabalin (LYRICA) 75 MG capsule Take 75 mg by mouth daily.   Unknown   TRULICITY 4.5 MG/0.5ML SOAJ Inject 4.5 mg into the skin once a week.   09/20/2023   azithromycin  (ZITHROMAX  Z-PAK) 250 MG tablet Take 2 tablets (500  mg) on  Day 1,  followed by 1 tablet (250 mg) once daily on Days 2 through 5. (Patient not taking: Reported on 08/26/2023) 6 each 0    Continuous Glucose Sensor (DEXCOM G7 SENSOR) MISC 1 each.      doxycycline (VIBRA-TABS) 100 MG tablet Take 100 mg by mouth 2 (two) times daily. (Patient not taking: Reported on 09/27/2023)   Not Taking   famotidine  (PEPCID ) 20 MG tablet Take 1 tablet (20 mg total) by mouth 2 (two) times daily. (Patient not taking: Reported on 09/27/2023) 60 tablet 0 Not Taking   HYDROcodone -acetaminophen  (NORCO) 5-325 MG tablet Take 1-2 tablets by mouth every 6 (six) hours as needed for moderate pain. MAXIMUM TOTAL ACETAMINOPHEN  DOSE IS 4000 MG PER DAY (Patient not taking: Reported on 09/27/2023) 30 tablet 0 Not Taking   ibuprofen (ADVIL) 200 MG tablet Take 800 mg by mouth every 8 (eight) hours as needed for moderate pain.  (Patient not taking: Reported on 09/27/2023)   Not Taking   metoprolol  tartrate (LOPRESSOR ) 50 MG tablet TAKE 1 TABLET 2 HR PRIOR TO CARDIAC PROCEDURE (Patient not  taking: Reported on 09/27/2023) 1 tablet 0 Not Taking   predniSONE  (DELTASONE ) 10 MG tablet Take 4 tabs (40 mg) PO x 3 days, then take 2 tabs (20 mg) PO x 3 days, then take 1 tab (10 mg) PO x 3 days, then take 1/2 tab (5 mg) PO x 4 days. (Patient not taking: Reported on 08/26/2023) 23 tablet 0    Scheduled:   amLODipine   5 mg Oral Daily   atorvastatin   40 mg Oral Daily   insulin  aspart  0-20 Units Subcutaneous TID WC   insulin  aspart  0-5 Units Subcutaneous QHS   metoprolol  succinate  12.5 mg Oral Daily   pantoprazole   40 mg Oral Daily   Infusions:   heparin  1,050 Units/hr (09/29/23 2255)    ASSESSMENT: 58 y.o. female with PMH HTN, DM is presenting with chest pain and abnormal cardiac CT showing hemodynamically significant stenosis in the proximal LAD and proximal-mid RCA with a recommendation of cardiac catheterization. Patient is not on chronic anticoagulation per chart review. Pharmacy has been consulted to initiate and manage heparin  intravenous infusion.  0920 0150 HL 0.51, therapeutic x 1 0920 0446 HL 0.51 0920 1257 HL 0.49, therapeutic x 2  0921 0447 HL 0.53 0922 0438 HL 0.29, subtherapeutic   Goal(s) of therapy: Heparin  level 0.3 - 0.7 units/mL Monitor platelets by anticoagulation protocol: Yes   PLAN: Increase heparin  infusion at 1200 units/hr. Recheck heparin  level in 6 hrs after rate change Recheck CBC daily w/ AM labs.   Cathaleen Blanch, PharmD [place] [facility]  09/30/2023 5:32 AM

## 2023-10-01 ENCOUNTER — Other Ambulatory Visit

## 2023-10-06 DIAGNOSIS — R55 Syncope and collapse: Secondary | ICD-10-CM

## 2023-10-07 ENCOUNTER — Telehealth: Payer: Self-pay | Admitting: Medical

## 2023-10-07 NOTE — Telephone Encounter (Signed)
 Pt called reporting that she has been experiencing shortness of breath with exertion, fatigue, and lightheadedness when moving around since her cath on 09/30/23. Pt denies swelling, presyncope, or syncope. Most recent vitals are as follows: BP 132/70, HR 69.  Appointment moved up to  10/08/23 for further evaluations. Pt made aware of ED precaution should any new symptoms develop or worsen.

## 2023-10-07 NOTE — Telephone Encounter (Signed)
 Pt c/o Shortness Of Breath: STAT if SOB developed within the last 24 hours or pt is noticeably SOB on the phone  1. Are you currently SOB (can you hear that pt is SOB on the phone)? no  2. How long have you been experiencing SOB? Yesterday   3. Are you SOB when sitting or when up moving around? Moving around   4. Are you currently experiencing any other symptoms? Procedure was done on Monday and the sob started 10/06/23. Please Advise

## 2023-10-08 ENCOUNTER — Emergency Department
Admission: EM | Admit: 2023-10-08 | Discharge: 2023-10-08 | Disposition: A | Discharge status: Home / Self Care | Attending: Emergency Medicine | Admitting: Emergency Medicine

## 2023-10-08 ENCOUNTER — Other Ambulatory Visit: Payer: Self-pay

## 2023-10-08 ENCOUNTER — Emergency Department

## 2023-10-08 ENCOUNTER — Ambulatory Visit: Admitting: Cardiology

## 2023-10-08 DIAGNOSIS — J40 Bronchitis, not specified as acute or chronic: Secondary | ICD-10-CM | POA: Insufficient documentation

## 2023-10-08 DIAGNOSIS — R0789 Other chest pain: Secondary | ICD-10-CM | POA: Diagnosis present

## 2023-10-08 DIAGNOSIS — E119 Type 2 diabetes mellitus without complications: Secondary | ICD-10-CM | POA: Insufficient documentation

## 2023-10-08 DIAGNOSIS — I2510 Atherosclerotic heart disease of native coronary artery without angina pectoris: Secondary | ICD-10-CM | POA: Insufficient documentation

## 2023-10-08 DIAGNOSIS — I10 Essential (primary) hypertension: Secondary | ICD-10-CM | POA: Diagnosis not present

## 2023-10-08 DIAGNOSIS — R059 Cough, unspecified: Secondary | ICD-10-CM | POA: Diagnosis not present

## 2023-10-08 DIAGNOSIS — J45909 Unspecified asthma, uncomplicated: Secondary | ICD-10-CM | POA: Diagnosis not present

## 2023-10-08 LAB — CBC
HCT: 41.9 % (ref 36.0–46.0)
Hemoglobin: 13.6 g/dL (ref 12.0–15.0)
MCH: 29.6 pg (ref 26.0–34.0)
MCHC: 32.5 g/dL (ref 30.0–36.0)
MCV: 91.1 fL (ref 80.0–100.0)
Platelets: 284 K/uL (ref 150–400)
RBC: 4.6 MIL/uL (ref 3.87–5.11)
RDW: 13.5 % (ref 11.5–15.5)
WBC: 8.9 K/uL (ref 4.0–10.5)
nRBC: 0 % (ref 0.0–0.2)

## 2023-10-08 LAB — BASIC METABOLIC PANEL WITH GFR
Anion gap: 10 (ref 5–15)
BUN: 30 mg/dL — ABNORMAL HIGH (ref 6–20)
CO2: 26 mmol/L (ref 22–32)
Calcium: 9.1 mg/dL (ref 8.9–10.3)
Chloride: 103 mmol/L (ref 98–111)
Creatinine, Ser: 1.34 mg/dL — ABNORMAL HIGH (ref 0.44–1.00)
GFR, Estimated: 45 mL/min — ABNORMAL LOW (ref >60)
Glucose, Bld: 114 mg/dL — ABNORMAL HIGH (ref 70–99)
Potassium: 3.5 mmol/L (ref 3.5–5.1)
Sodium: 139 mmol/L (ref 135–145)

## 2023-10-08 LAB — PROTIME-INR
INR: 1 (ref 0.8–1.2)
Prothrombin Time: 13.4 s (ref 11.4–15.2)

## 2023-10-08 LAB — RESP PANEL BY RT-PCR (RSV, FLU A&B, COVID)  RVPGX2
Influenza A by PCR: NEGATIVE
Influenza B by PCR: NEGATIVE
Resp Syncytial Virus by PCR: NEGATIVE
SARS Coronavirus 2 by RT PCR: NEGATIVE

## 2023-10-08 LAB — TROPONIN I (HIGH SENSITIVITY)
Troponin I (High Sensitivity): 6 ng/L (ref <18)
Troponin I (High Sensitivity): 6 ng/L (ref <18)

## 2023-10-08 MED ORDER — IOHEXOL 350 MG/ML SOLN
75.0000 mL | Freq: Once | INTRAVENOUS | Status: AC | PRN
  Administered 2023-10-08: 75 mL via INTRAVENOUS

## 2023-10-08 NOTE — ED Notes (Signed)
 Patient transported to CT

## 2023-10-08 NOTE — ED Provider Notes (Signed)
 [facility] Provider Note    Event Date/Time   First MD Initiated Contact with Patient 10/08/23 1503     (approximate)   History   Chief Complaint Chest Pain   HPI  Jennifer Lozano is a 58 y.o. female with past medical history of hypertension, diabetes, asthma, CAD, and hiatal hernia who presents to the ED complaining of chest pain.  Patient reports that she has had 2 days of productive cough with some difficulty breathing as well as mild pain in her chest.  She describes it as a sharp pain that is worse when she coughs.  She denies any fevers and has not had any nausea, vomiting, or diarrhea.  She is not aware of any sick contacts.  She did recently undergo stenting of her RCA 8 days ago following hospital admission for chest pain.     Physical Exam   Triage Vital Signs: ED Triage Vitals  Encounter Vitals Group     BP 10/08/23 1235 (!) 133/57     Girls Systolic BP Percentile --      Girls Diastolic BP Percentile --      Boys Systolic BP Percentile --      Boys Diastolic BP Percentile --      Pulse Rate 10/08/23 1235 62     Resp 10/08/23 1235 18     Temp 10/08/23 1235 97.7 F (36.5 C)     Temp src --      SpO2 10/08/23 1235 100 %     Weight 10/08/23 1232 221 lb (100.2 kg)     Height 10/08/23 1232 5' 2 (1.575 m)     Head Circumference --      Peak Flow --      Pain Score 10/08/23 1231 9     Pain Loc --      Pain Education --      Exclude from Growth Chart --     Most recent vital signs: Vitals:   10/08/23 1235 10/08/23 1514  BP: (!) 133/57 129/69  Pulse: 62 (!) 58  Resp: 18 18  Temp: 97.7 F (36.5 C)   SpO2: 100% 100%    Constitutional: Alert and oriented. Eyes: Conjunctivae are normal. Head: Atraumatic. Nose: No congestion/rhinnorhea. Mouth/Throat: Mucous membranes are moist.  Cardiovascular: Normal rate, regular rhythm. Grossly normal heart sounds.  2+ radial pulses bilaterally. Respiratory: Normal respiratory effort.  No  retractions. Lungs CTAB. Gastrointestinal: Soft and nontender. No distention. Musculoskeletal: No lower extremity tenderness nor edema.  Edema and ecchymosis around right radial artery with palpable pulse and no erythema, warmth, or tenderness. Neurologic:  Normal speech and language. No gross focal neurologic deficits are appreciated.    ED Results / Procedures / Treatments   Labs (all labs ordered are listed, but only abnormal results are displayed) Labs Reviewed  BASIC METABOLIC PANEL WITH GFR - Abnormal; Notable for the following components:      Result Value   Glucose, Bld 114 (*)    BUN 30 (*)    Creatinine, Ser 1.34 (*)    GFR, Estimated 45 (*)    All other components within normal limits  RESP PANEL BY RT-PCR (RSV, FLU A&B, COVID)  RVPGX2  CBC  PROTIME-INR  TROPONIN I (HIGH SENSITIVITY)  TROPONIN I (HIGH SENSITIVITY)     EKG  ED ECG REPORT I, Carlin Palin, the attending physician, personally viewed and interpreted this ECG.   Date: 10/08/2023  EKG Time: 12:31  Rate: 61  Rhythm: normal  sinus rhythm  Axis: Normal  Intervals:none  ST&T Change: None  RADIOLOGY Chest x-ray reviewed and interpreted by me with no infiltrate, edema, or effusion.  PROCEDURES:  Critical Care performed: No  Procedures   MEDICATIONS ORDERED IN ED: Medications  iohexol  (OMNIPAQUE ) 350 MG/ML injection 75 mL (75 mLs Intravenous Contrast Given 10/08/23 1518)     IMPRESSION / MDM / ASSESSMENT AND PLAN / ED COURSE  I reviewed the triage vital signs and the nursing notes.                              58 y.o. female with past medical history of hypertension, diabetes, CAD, asthma, and hiatal hernia presents to the ED complaining of 2 days of cough, shortness of breath, generalized weakness, and chest discomfort when coughing.  Patient's presentation is most consistent with acute presentation with potential threat to life or bodily function.  Differential diagnosis includes, but  is not limited to, ACS, PE, pneumonia, bronchitis, COVID-19, influenza, anemia, electrolyte abnormality, AKI.  Patient nontoxic-appearing and in no acute distress, vital signs are unremarkable.  EKG shows no evidence of arrhythmia or ischemia and 2 sets of troponin are within normal limits, doubt ACS given atypical symptoms.  CTA chest is negative for PE or other acute finding, no evidence of pneumonia and I suspect bronchitis or other viral illness.  COVID and flu testing is pending, labs without significant anemia, leukocytosis, electrolyte abnormality, or AKI.  Patient appropriate for discharge home with symptomatic treatment and outpatient follow-up with her PCP.  She was counseled to return to the ED for new or worsening symptoms, patient agrees with plan.      FINAL CLINICAL IMPRESSION(S) / ED DIAGNOSES   Final diagnoses:  Atypical chest pain  Bronchitis     Rx / DC Orders   ED Discharge Orders     None        Note:  This document was prepared using Dragon voice recognition software and may include unintentional dictation errors.   Willo Dunnings, MD 10/08/23 [phone]

## 2023-10-08 NOTE — ED Triage Notes (Signed)
 Pt comes in via pov with complaints of chest pain that started about an hour ago. Pt states that she started feeling sluggish on Sunday, and reports having a stent and balloon placed in her heart last Monday. Pt complains of chest pain 9/10 that comes and goes when she breathes, but has no complaints of SOB. Pt is currently on plavix.

## 2023-10-10 ENCOUNTER — Ambulatory Visit: Admitting: Cardiology

## 2023-10-22 ENCOUNTER — Ambulatory Visit: Admitting: Medical

## 2023-11-06 ENCOUNTER — Other Ambulatory Visit

## 2023-11-12 ENCOUNTER — Ambulatory Visit: Attending: Medical | Admitting: Medical

## 2023-11-12 ENCOUNTER — Encounter: Payer: Self-pay | Admitting: Medical

## 2023-11-12 VITALS — BP 118/78 | HR 68 | Ht 62.0 in | Wt 225.0 lb

## 2023-11-12 DIAGNOSIS — I2510 Atherosclerotic heart disease of native coronary artery without angina pectoris: Secondary | ICD-10-CM

## 2023-11-12 DIAGNOSIS — I10 Essential (primary) hypertension: Secondary | ICD-10-CM | POA: Diagnosis not present

## 2023-11-12 DIAGNOSIS — E785 Hyperlipidemia, unspecified: Secondary | ICD-10-CM | POA: Diagnosis not present

## 2023-11-12 DIAGNOSIS — I471 Supraventricular tachycardia, unspecified: Secondary | ICD-10-CM

## 2023-11-12 DIAGNOSIS — R55 Syncope and collapse: Secondary | ICD-10-CM

## 2023-11-12 DIAGNOSIS — Z79899 Other long term (current) drug therapy: Secondary | ICD-10-CM

## 2023-11-12 DIAGNOSIS — I25118 Atherosclerotic heart disease of native coronary artery with other forms of angina pectoris: Secondary | ICD-10-CM

## 2023-11-12 NOTE — Progress Notes (Unsigned)
 Cardiology Office Note   Date:  11/12/2023  ID:  Jennifer Lozano, DOB May 13, 1965, MRN [id number] PCP: Buren Rock HERO, MD  [facility] Providers Cardiologist:  None   History of Present Illness Jennifer Lozano is a 58 y.o. female with a history of diabetes, asthma, HLD, hypertension, CAD s/p DES/PCI dRCA, syncope who presents for follow-up for CAD and sycnope.   Patient was seen in the ER 07/20/2023 with syncope.  Patient had vomiting, nausea, diarrhea for 3 days. She reports she passed out 3 times at home.  Vitals were stable.  Labs showed serum creatinine 1.27, BUN 28, WBC 12.5.  EKG showed normal sinus rhythm, 87 bpm.  She was given IV fluids, antinausea medication, PPI.  She was discharged with Pepcid  and Zofran . Carotid ultrasound showed no significant disease.  Subsequent heart monitor showed predominantly normal sinus rhythm with an average heart rate of 69 bpm, paroxysmal Afib (SVT labeled incorrectly as SVT), 1 run of NSVT lasting 12 beats, 47 runs of SVT lasting up to 6.2 seconds.  She was started on Toprol  12.5 mg daily.  Echo and cardiac CTA were ordered.  Coronary CTA on 09/27/2023 showed a calcium  score of 248 which was 95th percentile, severe CAD disease involving the proximal LAD, proximal to mid RCA, and proximal PAD estimated at 70 to 99% stenosis.  CT FFR showed significant stenosis in the proximal LAD and proximal to mid RCA as outlined below with recommendations for cardiac cath.  After she underwent coronary CT the patient developed pinpoint sharp split-second lasting episodes of chest discomfort while driving home with associated nausea and she was admitted on 09/27/2023 with unstable angina.  High-sensitivity troponin was negative x 4.  Cardiac cath showed significant two-vessel CAD, normal LVSF with mildly elevated LVEDP.  The patient was treated with successful complex bifurcation angioplasty and drug-eluting stent placement using provisional stent technique.  The right PDA  was ballooned without stent placement.  The stent at the distal RCA extending into the right AV groove.  The patient was started on DAPT with aspirin  and ticagrelor  for at least 6 months.  Can consider staged LAD PCI as an outpatient but might have to consider alternative access given severe right arm discomfort difficulty torquing the catheter due to small radial artery size and suspected spasm.  Echo showed EF 55 to 60%.  Today, the patient denies chest pain or SOB. She is still having fainting spells. Three weeks ago she passed out three times at church. Episodes start with her stomach feeling poorly, and then she goes to the bathroom. After this she passes out. She feels sweaty before she passes out. The patient is unsure of she wants to pursue staged PCI of the LAD. She says she felt every part of the procedure and it was very uncomfortable. Cath site has healed well.   Studies Reviewed EKG Interpretation Date/Time:  Tuesday November 12 2023 07:56:26 EST Ventricular Rate:  68 PR Interval:  160 QRS Duration:  72 QT Interval:  410 QTC Calculation: 435 R Axis:   3  Text Interpretation: Normal sinus rhythm Normal ECG When compared with ECG of 08-Oct-2023 12:31, Sinus rhythm has replaced Junctional rhythm Confirmed by Franchester, Veleka Djordjevic ([id number]) on 11/12/2023 7:57:19 AM    LHC 09/2023   Dist RCA lesion is 60% stenosed with 95% stenosed side branch in RPAV.   Mid LAD lesion is 70% stenosed.   Prox LAD lesion is 30% stenosed.   RPDA lesion is 90% stenosed.   Prox  RCA lesion is 40% stenosed.   A stent was successfully placed.   A drug-eluting stent was successfully placed using a STENT ONYX FRONTIER 2.5X22 in the main branch and side branch.   in the main branch and side branch.   in the main branch and side branch.   Scoring balloon angioplasty was performed using a BALLOON SCOREFLEX 2.50X15.   Angioplasty was performed in the main branch and side branch. .   Angioplasty was performed in the main  branch and side branch. .   Angioplasty was performed in the main branch and side branch. .   Post intervention, there is a 30% residual stenosis.   Post intervention, there is a 0% residual stenosis.   Post intervention, the side branch was reduced to 0% residual stenosis.   The left ventricular systolic function is normal.   LV end diastolic pressure is mildly elevated.   The left ventricular ejection fraction is 55-65% by visual estimate.   Recommend uninterrupted dual antiplatelet therapy with Aspirin  81mg  daily and Ticagrelor  90mg  twice daily for a minimum of 6 months (stable ischemic heart disease-Class I recommendation).   1.  Significant two-vessel coronary artery disease. 2.  Normal LV systolic function mildly elevated left ventricular end-diastolic pressure at 18 mmHg. 3.  Successful complex bifurcation angioplasty and drug-eluting stent placement using provisional stenting technique.  The right PDA was ballooned without stent placement.  The stent at the distal RCA extended into the right AV groove.   Recommendations: Dual antiplatelet therapy for at least 6 months. Aggressive treatment of risk factors. Will consider staged LAD PCI as an outpatient but might have to consider alternative access given severe right arm discomfort and difficulty torquing the catheter due to small radial artery size and suspected spasm.   Recommendations  Antiplatelet/Anticoag Recommend uninterrupted dual antiplatelet therapy with Aspirin  81mg  daily and Ticagrelor  90mg  twice daily for a minimum of 6 months (stable ischemic heart disease-Class I recommendation).   Echo 09/2023 1. Left ventricular ejection fraction, by estimation, is 55 to 60%. The  left ventricle has normal function. The left ventricle has no regional  wall motion abnormalities. Left ventricular diastolic parameters were  normal.   2. Right ventricular systolic function is normal. The right ventricular  size is normal. Tricuspid  regurgitation signal is inadequate for assessing  PA pressure.   3. The mitral valve is normal in structure. No evidence of mitral valve  regurgitation. No evidence of mitral stenosis.   4. The aortic valve is normal in structure. Aortic valve regurgitation is  not visualized. Aortic valve sclerosis is present, with no evidence of  aortic valve stenosis.   Heart monitor 09/2023 Conclusion Average heart rate 66, range 49-169 bpm. 17 episodes of nonsustained SVT, longest lasting 20 beats, fastest 7 beats. No atrial fibrillation or atrial flutter. No sustained arrhythmias.  Heart monitor 09/2023 Conclusion Average heart rate 69, range 51-2 11. Paroxysmal atrial fibrillation noted ( strip 7, episodes of A-fib was incorrectly labeled as SVT). 1 run of nonsustained VT lasting 12 beats. Episodes of nonsustained SVT, longest lasting 16 seconds.       Physical Exam VS:  BP 118/78 (BP Location: Left Arm, Patient Position: Sitting, Cuff Size: Normal)   Pulse 68   Ht 5' 2 (1.575 m)   Wt 225 lb (102.1 kg)   SpO2 94%   BMI 41.15 kg/m        Wt Readings from Last 3 Encounters:  11/12/23 225 lb (102.1 kg)  10/08/23  221 lb (100.2 kg)  09/27/23 217 lb (98.4 kg)    GEN: Well nourished, well developed in no acute distress NECK: No JVD; No carotid bruits CARDIAC: RRR, no murmurs, rubs, gallops RESPIRATORY:  Clear to auscultation without rales, wheezing or rhonchi  ABDOMEN: Soft, non-tender, non-distended EXTREMITIES:  No edema; No deformity   ASSESSMENT AND PLAN  CAD Heart monitor for syncope showed 1 run of nonsustained VT lasting 12 beats.  This resulted in cardiac CTA which showed severe CAD in the proximal LAD, proximal mid RCA, proximal PDA of 70-99% stenosis.  CT FFR showed hemodynamically significant stenosis in the proximal LAD and proximal to mid RCA.  Cardiac cath was recommended.  Left heart cath showed significant two-vessel CAD, normal LVSF, mildly elevated LVEDP at 18 mmHg.   Patient underwent successful complex bifurcation angioplasty and drug-eluting stent placement using provisional stenting technique.  The right PDA was ballooned without stent placement.  The stent at the distal RCA extended into the right AV groove.  Patient was started on DAPT with aspirin  and ticagrelor  for at least 6 months.  Will consider staged PCI of the LAD as outpatient, but might have to consider alternative access given severe right arm discomfort and difficulty torquing the catheter due to small radial artery or size and suspected spasm.  Today, patient is overall doing okay.  She reports the procedure was very uncomfortable and is unsure if she would like to repeat it.  I recommended she discuss this with the MD.  She denies any chest pain or shortness of breath.  Cath site has healed well.  We will continue DAPT with aspirin  81 mg daily and Brilinta  90 mg twice daily for at least 6 months.  Will wait on cardiac rehab given possibility of staged PCI to the LAD.  Continue Lipitor 40 mg daily, Toprol  12.5 mg daily, amlodipine  5 mg daily.  syncope Patient reports persistent syncope that occurs after using the bathroom.  She reported GI issues for the last 3 years with intermittent syncope.  She said 3 weeks ago she had 3 episodes of syncope in 1 day.  Patient feels diaphoretic prior to passing out.  Patient had a 30-day heart monitor that NSR, possible Afib (labeled as SVT on 1 strip), 1 run of NSVT. Due to NSVT cardiac CTA was ordered and she was found to have CAD. She says during the heart monitor she did not have any syncopal episodes. I will refer to EP to consider loop recorder.   Possible Afib First heart monitor showed possible Afib. Will review with MD. CHADSVASC of (HTN, PAD, DM2, female) at least 4. She would qualify for long-term a/c.   HLD I will update fasting lipids today. Continue Lipitor 40mg  daily. Prior LDL 80.   HTN Blood pressure is good today.  Continue amlodipine  5 mg  daily, lisinopril-hydrochlorothiazide 20-25 mg daily, Toprol  12.5 mg daily.    Dispo: Follow-up with MD in 1 month  Signed, Mansoor Hillyard VEAR Fishman, PA-C

## 2023-11-12 NOTE — Patient Instructions (Signed)
 Medication Instructions:   Your physician recommends that you continue on your current medications as directed. Please refer to the Current Medication list given to you today.    *If you need a refill on your cardiac medications before your next appointment, please call your pharmacy*  Lab Work:  Your provider would like for you to have following labs drawn today BMP, CBC, Lipid Panel.    If you have labs (blood work) drawn today and your tests are completely normal, you will receive your results only by:  MyChart Message (if you have MyChart) OR  A paper copy in the mail If you have any lab test that is abnormal or we need to change your treatment, we will call you to review the results.  Testing/Procedures:  None ordered at this time   Referrals:  None ordered at this time   Follow-Up:  At [facility], you and your health needs are our priority.  As part of our continuing mission to provide you with exceptional heart care, our providers are all part of one team.  This team includes your primary Cardiologist (physician) and Advanced Practice Providers or APPs (Physician Assistants and Nurse Practitioners) who all work together to provide you with the care you need, when you need it.  Your next appointment:   1 month(s)  Provider:    Deatrice Cage, MD    We recommend signing up for the patient portal called MyChart.  Sign up information is provided on this After Visit Summary.  MyChart is used to connect with patients for Virtual Visits (Telemedicine).  Patients are able to view lab/test results, encounter notes, upcoming appointments, etc.  Non-urgent messages can be sent to your provider as well.   To learn more about what you can do with MyChart, go to [email or web address].

## 2023-11-13 LAB — CBC
Hematocrit: 40.5 % (ref 34.0–46.6)
Hemoglobin: 13.3 g/dL (ref 11.1–15.9)
MCH: 30.9 pg (ref 26.6–33.0)
MCHC: 32.8 g/dL (ref 31.5–35.7)
MCV: 94 fL (ref 79–97)
Platelets: 283 x10E3/uL (ref 150–450)
RBC: 4.31 x10E6/uL (ref 3.77–5.28)
RDW: 13.3 % (ref 11.7–15.4)
WBC: 8.7 x10E3/uL (ref 3.4–10.8)

## 2023-11-13 LAB — LIPID PANEL
Chol/HDL Ratio: 2.9 ratio (ref 0.0–4.4)
Cholesterol, Total: 112 mg/dL (ref 100–199)
HDL: 39 mg/dL — ABNORMAL LOW (ref >39)
LDL Chol Calc (NIH): 55 mg/dL (ref 0–99)
Triglycerides: 94 mg/dL (ref 0–149)
VLDL Cholesterol Cal: 18 mg/dL (ref 5–40)

## 2023-11-13 LAB — BASIC METABOLIC PANEL WITH GFR
BUN/Creatinine Ratio: 19 (ref 12–28)
BUN: 23 mg/dL (ref 8–27)
CO2: 24 mmol/L (ref 20–29)
Calcium: 9.6 mg/dL (ref 8.7–10.3)
Chloride: 102 mmol/L (ref 96–106)
Creatinine, Ser: 1.22 mg/dL — ABNORMAL HIGH (ref 0.57–1.00)
Glucose: 141 mg/dL — ABNORMAL HIGH (ref 70–99)
Potassium: 4.2 mmol/L (ref 3.5–5.2)
Sodium: 142 mmol/L (ref 134–144)
eGFR: 51 mL/min/1.73 — ABNORMAL LOW (ref >59)

## 2023-11-14 ENCOUNTER — Ambulatory Visit: Payer: Self-pay | Admitting: Medical

## 2023-11-25 NOTE — Progress Notes (Signed)
 Possible viral like illness - including covid/flu

## 2023-12-25 ENCOUNTER — Ambulatory Visit: Admitting: Cardiovascular Disease

## 2024-01-20 ENCOUNTER — Emergency Department

## 2024-01-20 ENCOUNTER — Other Ambulatory Visit: Payer: Self-pay

## 2024-01-20 ENCOUNTER — Emergency Department
Admission: EM | Admit: 2024-01-20 | Discharge: 2024-01-20 | Disposition: A | Discharge status: Home / Self Care | Attending: Emergency Medicine | Admitting: Emergency Medicine

## 2024-01-20 ENCOUNTER — Encounter: Payer: Self-pay | Admitting: Medical Oncology

## 2024-01-20 DIAGNOSIS — R0602 Shortness of breath: Secondary | ICD-10-CM | POA: Diagnosis not present

## 2024-01-20 DIAGNOSIS — J45909 Unspecified asthma, uncomplicated: Secondary | ICD-10-CM | POA: Insufficient documentation

## 2024-01-20 DIAGNOSIS — R0781 Pleurodynia: Secondary | ICD-10-CM | POA: Diagnosis present

## 2024-01-20 LAB — BASIC METABOLIC PANEL WITH GFR
Anion gap: 9 (ref 5–15)
BUN: 25 mg/dL — ABNORMAL HIGH (ref 6–20)
CO2: 26 mmol/L (ref 22–32)
Calcium: 9.4 mg/dL (ref 8.9–10.3)
Chloride: 103 mmol/L (ref 98–111)
Creatinine, Ser: 1.21 mg/dL — ABNORMAL HIGH (ref 0.44–1.00)
GFR, Estimated: 51 mL/min — ABNORMAL LOW
Glucose, Bld: 137 mg/dL — ABNORMAL HIGH (ref 70–99)
Potassium: 3.9 mmol/L (ref 3.5–5.1)
Sodium: 138 mmol/L (ref 135–145)

## 2024-01-20 LAB — CBC
HCT: 38.4 % (ref 36.0–46.0)
Hemoglobin: 12.9 g/dL (ref 12.0–15.0)
MCH: 30.5 pg (ref 26.0–34.0)
MCHC: 33.6 g/dL (ref 30.0–36.0)
MCV: 90.8 fL (ref 80.0–100.0)
Platelets: 266 K/uL (ref 150–400)
RBC: 4.23 MIL/uL (ref 3.87–5.11)
RDW: 12.8 % (ref 11.5–15.5)
WBC: 7.2 K/uL (ref 4.0–10.5)
nRBC: 0 % (ref 0.0–0.2)

## 2024-01-20 LAB — TROPONIN T, HIGH SENSITIVITY: Troponin T High Sensitivity: 18 ng/L (ref 0–19)

## 2024-01-20 MED ORDER — IBUPROFEN 600 MG PO TABS: 600.0000 mg | ORAL_TABLET | Freq: Three times a day (TID) | ORAL | 0 refills | Status: DC | PRN

## 2024-01-20 NOTE — ED Triage Notes (Signed)
 Pt ambulatory to triage with reports of generalized Chest pain off and on with sob x 3 days. Recently had bronchitis. NAD noted.

## 2024-01-20 NOTE — ED Provider Notes (Signed)
 "  [facility] Provider Note   Event Date/Time   First MD Initiated Contact with Patient 01/20/24 [phone]     (approximate) History  Chest Pain and Shortness of Breath  HPI Jennifer Lozano is a 60 y.o. female with a stated past medical history of asthma who presents complaining of cough, chest pain, and chest congestion present over the last 3 days after recently being diagnosed with bronchitis 3 weeks prior to arrival.  Patient is concerned that she has also had a stent placed in the past however denies this feeling similar to ACS type pain that she is having.  Patient does endorse mild increase in pain with taking a deep breath.  Patient denies any fevers, recent travel, sick contacts, or food at the ordinary ROS: Patient currently denies any vision changes, tinnitus, difficulty speaking, facial droop, sore throat, abdominal pain, nausea/vomiting/diarrhea, dysuria, or weakness/numbness/paresthesias in any extremity   Physical Exam  Triage Vital Signs: ED Triage Vitals  Encounter Vitals Group     BP 01/20/24 0830 137/70     Girls Systolic BP Percentile --      Girls Diastolic BP Percentile --      Boys Systolic BP Percentile --      Boys Diastolic BP Percentile --      Pulse Rate 01/20/24 0830 62     Resp 01/20/24 0830 18     Temp 01/20/24 0830 98.2 F (36.8 C)     Temp Source 01/20/24 0830 Oral     SpO2 01/20/24 0830 100 %     Weight 01/20/24 0831 219 lb (99.3 kg)     Height 01/20/24 0831 5' 2 (1.575 m)     Head Circumference --      Peak Flow --      Pain Score 01/20/24 0830 7     Pain Loc --      Pain Education --      Exclude from Growth Chart --    Most recent vital signs: Vitals:   01/20/24 0830  BP: 137/70  Pulse: 62  Resp: 18  Temp: 98.2 F (36.8 C)  SpO2: 100%   General: Awake, oriented x4. CV:  Good peripheral perfusion. Resp:  Normal effort.  CTAB Abd:  No distention. Other:  Middle-aged obese Caucasian female resting comfortably in no  acute distress ED Results / Procedures / Treatments  Labs (all labs ordered are listed, but only abnormal results are displayed) Labs Reviewed  BASIC METABOLIC PANEL WITH GFR - Abnormal; Notable for the following components:      Result Value   Glucose, Bld 137 (*)    BUN 25 (*)    Creatinine, Ser 1.21 (*)    GFR, Estimated 51 (*)    All other components within normal limits  CBC  TROPONIN T, HIGH SENSITIVITY  TROPONIN T, HIGH SENSITIVITY   EKG ED ECG REPORT I, Artist MARLA Kerns, the attending physician, personally viewed and interpreted this ECG. Date: 01/20/2024 EKG Time: 0827 Rate: 62 Rhythm: normal sinus rhythm QRS Axis: normal Intervals: normal ST/T Wave abnormalities: normal Narrative Interpretation: no evidence of acute ischemia RADIOLOGY ED MD interpretation: 2 view chest x-ray interpreted by me shows no evidence of acute abnormalities including no pneumonia, pneumothorax, or widened mediastinum - All radiology independently interpreted and agree with radiology assessment Official radiology report(s): DG Chest 2 View Result Date: 01/20/2024 CLINICAL DATA:  Chest pain. EXAM: CHEST - 2 VIEW COMPARISON:  10/08/2023. FINDINGS: The heart size and mediastinal contours  are unchanged. Aortic atherosclerosis. No overt pulmonary edema. No focal consolidation, pleural effusion, or pneumothorax. No acute osseous abnormality. IMPRESSION: No acute cardiopulmonary findings. Electronically Signed   By: Harrietta Sherry M.D.   On: 01/20/2024 10:18   PROCEDURES: Critical Care performed: No Procedures MEDICATIONS ORDERED IN ED: Medications - No data to display IMPRESSION / MDM / ASSESSMENT AND PLAN / ED COURSE  I reviewed the triage vital signs and the nursing notes.                             The patient is on the cardiac monitor to evaluate for evidence of arrhythmia and/or significant heart rate changes. Patient's presentation is most consistent with acute presentation with  potential threat to life or bodily function. Patient is a 59 year old female with the above-stated past medical history presents complaining of shortness of breath, cough, and chest pain after being diagnosed with bronchitis 2 weeks prior to arrival. DDx: Pneumonia, bacterial bronchitis, upper respiratory infection, postnasal drip Plan: CBC, BMP, troponin, chest x-ray, EKG  Laboratory and radiologic valuation as well as physical exam with show any evidence of red flag symptomatology.  Patient's chest x-ray negative and troponin negative x 1.  Patient's chest pain has been going on for longer than 24 hours and therefore presume troponin elevation if cardiac ischemia was present.  Patient did just have recent upper respiratory infection however no enlarged cardiac silhouette and patient does not have any pericardial rub on exam.  Patient does have pleuritic aspect of this chest pain and may simply have pleurodynia.  Patient was counseled on the use of anti-inflammatory medications as well as follow-up with her primary care physician, Dr. Buren if symptoms did not improve.  Patient agrees with plan and all questions were answered.  Patient given strict return precautions prior to discharge  Dispo: Discharge home with PCP follow-up   FINAL CLINICAL IMPRESSION(S) / ED DIAGNOSES   Final diagnoses:  Pleuritic chest pain  Shortness of breath   Rx / DC Orders   ED Discharge Orders          Ordered    ibuprofen  (ADVIL ) 600 MG tablet  Every 8 hours PRN        01/20/24 1110           Note:  This document was prepared using Dragon voice recognition software and may include unintentional dictation errors.   Ludy Messamore K, MD 01/20/24 1111  "

## 2024-01-20 NOTE — Discharge Instructions (Signed)
 Please use ibuprofen (Motrin) up to 800 mg every 8 hours, naproxen (Naprosyn) up to 500 mg every 12 hours, and/or acetaminophen (Tylenol) up to 4 g/day for any continued pain.  Please do not use this medication regimen for longer than 7 days

## 2024-01-27 ENCOUNTER — Ambulatory Visit: Admitting: Medical

## 2024-01-27 NOTE — Progress Notes (Unsigned)
 " Cardiology Office Note   Date:  01/27/2024  ID:  Jennifer Lozano, DOB 1965-09-16, MRN [id number] PCP: Buren Rock HERO, MD  [facility] Providers Cardiologist:  None { Click to update primary MD,subspecialty MD or APP then REFRESH:1}    History of Present Illness Jennifer Lozano is a 59 y.o. female with a history of diabetes, asthma, HLD, hypertension, CAD s/p DES/PCI dRCA, syncope who presents for follow-up for CAD and sycnope.   Patient was seen in the ER 07/20/2023 with syncope.  Patient had vomiting, nausea, diarrhea for 3 days. She reports she passed out 3 times at home.  Vitals were stable.  Labs showed serum creatinine 1.27, BUN 28, WBC 12.5.  EKG showed normal sinus rhythm, 87 bpm.  She was given IV fluids, antinausea medication, PPI.  She was discharged with Pepcid  and Zofran . Carotid ultrasound showed no significant disease.  Subsequent heart monitor showed predominantly normal sinus rhythm with an average heart rate of 69 bpm, paroxysmal Afib (SVT labeled incorrectly as SVT), 1 run of NSVT lasting 12 beats, 47 runs of SVT lasting up to 6.2 seconds.  She was started on Toprol  12.5 mg daily.  Echo and cardiac CTA were ordered.  Coronary CTA on 09/27/2023 showed a calcium  score of 248 which was 95th percentile, severe CAD disease involving the proximal LAD, proximal to mid RCA, and proximal PAD estimated at 70 to 99% stenosis.  CT FFR showed significant stenosis in the proximal LAD and proximal to mid RCA as outlined below with recommendations for cardiac cath.  After she underwent coronary CT the patient developed pinpoint sharp split-second lasting episodes of chest discomfort while driving home with associated nausea and she was admitted on 09/27/2023 with unstable angina.  High-sensitivity troponin was negative x 4.  Cardiac cath showed significant two-vessel CAD, normal LVSF with mildly elevated LVEDP.  The patient was treated with successful complex bifurcation angioplasty and  drug-eluting stent placement using provisional stent technique.  The right PDA was ballooned without stent placement.  The stent at the distal RCA extending into the right AV groove.  The patient was started on DAPT with aspirin  and ticagrelor  for at least 6 months.  Can consider staged LAD PCI as an outpatient but might have to consider alternative access given severe right arm discomfort difficulty torquing the catheter due to small radial artery size and suspected spasm.  Echo showed EF 55 to 60%.  The patient was last seen 11/12/2023 reporting persistent fainting spells.  Heart monitor showed normal sinus rhythm with an average heart rate of 66 bpm, 17 runs of nonsustained SVT, longest lasting 20 beats.  ROS: ***  Studies Reviewed      *** Risk Assessment/Calculations {Does this patient have ATRIAL FIBRILLATION?:[phone]} No BP recorded.  {Refresh Note OR Click here to enter BP  :1}***       Physical Exam VS:  There were no vitals taken for this visit.       Wt Readings from Last 3 Encounters:  01/20/24 219 lb (99.3 kg)  11/12/23 225 lb (102.1 kg)  10/08/23 221 lb (100.2 kg)    GEN: Well nourished, well developed in no acute distress NECK: No JVD; No carotid bruits CARDIAC: ***RRR, no murmurs, rubs, gallops RESPIRATORY:  Clear to auscultation without rales, wheezing or rhonchi  ABDOMEN: Soft, non-tender, non-distended EXTREMITIES:  No edema; No deformity   ASSESSMENT AND PLAN ***    {Are you ordering a CV Procedure (e.g. stress test, cath, DCCV, TEE, etc)?  Press F2        :[id number]}  Dispo: ***  Signed, Amiria Orrison VEAR Fishman, PA-C   "

## 2024-01-28 ENCOUNTER — Ambulatory Visit: Attending: Cardiovascular Disease | Admitting: Cardiovascular Disease

## 2024-01-28 ENCOUNTER — Encounter: Payer: Self-pay | Admitting: Cardiovascular Disease

## 2024-01-28 VITALS — BP 140/80 | HR 69 | Ht 62.0 in | Wt 225.4 lb

## 2024-01-28 DIAGNOSIS — I10 Essential (primary) hypertension: Secondary | ICD-10-CM

## 2024-01-28 DIAGNOSIS — E785 Hyperlipidemia, unspecified: Secondary | ICD-10-CM | POA: Diagnosis not present

## 2024-01-28 DIAGNOSIS — I25110 Atherosclerotic heart disease of native coronary artery with unstable angina pectoris: Secondary | ICD-10-CM

## 2024-01-28 NOTE — H&P (View-Only) (Signed)
 "    Cardiology Office Note   Date:  01/28/2024   ID:  Jennifer Lozano, DOB 06-22-1965, MRN [id number]  PCP:  Buren Rock HERO, MD  Cardiologist:   Deatrice Cage, MD   Chief Complaint  Patient presents with   Follow-up    F/u ER chest pain/sob c/o intermittent chest pain/ syncope and sob. Meds reviewed verbally with pt.      History of Present Illness: Jennifer Lozano is a 59 y.o. female who presents for a follow-up visit regarding coronary artery disease. She has known history of diabetes mellitus, asthma, hyperlipidemia and hypertension. She was seen in the ER in July 2025 with syncope after 3 days of nausea, vomiting and diarrhea.  Labs showed evidence of volume depletion.  Outpatient monitor showed short runs of SVT.  She was started on small dose Toprol .  Cardiac CTA showed a calcium  score of 248 with two-vessel coronary artery disease. Cardiac catheterization was done in September 2025 which showed significant two-vessel coronary artery disease, normal LV systolic function and mildly elevated left ventricular end-diastolic pressure.  I performed successful complex bifurcation angioplasty and drug-eluting stent placement to the distal RCA with stent extending into the AV groove branch.  Right PDA was ballooned without stent placement.  There was also a 70% stenosis in the mid LAD.  Echocardiogram showed normal LV systolic function.  She started having chest pain again 2 weeks ago described as heaviness and aching sensation that is happening with exertion and sometimes at rest.  She has significant exertional dyspnea as well and had a syncopal episode last week when she was trying to pick up a bag of dog food.  She went to the emergency room on January 12.  EKG showed no ischemic changes and troponin was normal.   Past Medical History:  Diagnosis Date   Asthma    COVID-19 07/15/2019   diagnosed in outpatient clinic on 07/15/2019   Diabetes mellitus without complication (HCC)    History  of hiatal hernia    Hypertension    PONV (postoperative nausea and vomiting)     Past Surgical History:  Procedure Laterality Date   ABDOMINAL HYSTERECTOMY     CARPAL TUNNEL RELEASE Right 10/08/2018   Procedure: CARPAL TUNNEL RELEASE ENDOSCOPIC;  Surgeon: Edie Norleen PARAS, MD;  Location: [facility] ORS;  Service: Orthopedics;  Laterality: Right;   CARPAL TUNNEL RELEASE Left 11/05/2018   Procedure: CARPAL TUNNEL RELEASE ENDOSCOPIC;  Surgeon: Edie Norleen PARAS, MD;  Location: [facility] ORS;  Service: Orthopedics;  Laterality: Left;   CATARACT EXTRACTION, BILATERAL     CHOLECYSTECTOMY     COLONOSCOPY WITH PROPOFOL  N/A 04/15/2017   Procedure: COLONOSCOPY WITH PROPOFOL ;  Surgeon: Unk Corinn Skiff, MD;  Location: [facility] ENDOSCOPY;  Service: Gastroenterology;  Laterality: N/A;   CORONARY STENT INTERVENTION N/A 09/30/2023   Procedure: CORONARY STENT INTERVENTION;  Surgeon: Cage Deatrice LABOR, MD;  Location: [facility] INVASIVE CV LAB;  Service: Cardiovascular;  Laterality: N/A;   DORSAL COMPARTMENT RELEASE Right 10/08/2018   Procedure: DE QUERVAIN'S TENOSYNOVITIS RELEASE;  Surgeon: Edie Norleen PARAS, MD;  Location: [facility] ORS;  Service: Orthopedics;  Laterality: Right;   LEFT HEART CATH AND CORONARY ANGIOGRAPHY N/A 09/30/2023   Procedure: LEFT HEART CATH AND CORONARY ANGIOGRAPHY;  Surgeon: Cage Deatrice LABOR, MD;  Location: [facility] INVASIVE CV LAB;  Service: Cardiovascular;  Laterality: N/A;   TONSILLECTOMY       Current Outpatient Medications  Medication Sig Dispense Refill   albuterol (VENTOLIN HFA) 108 (90 Base) MCG/ACT inhaler Inhale  2 puffs into the lungs.     amLODipine  (NORVASC ) 5 MG tablet Take 5 mg by mouth daily.     atorvastatin  (LIPITOR) 40 MG tablet Take 40 mg by mouth daily.     Continuous Glucose Sensor (DEXCOM G7 SENSOR) MISC 1 each.     LANTUS SOLOSTAR 100 UNIT/ML Solostar Pen Inject 40 Units into the skin daily.     levothyroxine (SYNTHROID, LEVOTHROID) 125 MCG tablet Take 125 mcg by mouth daily before breakfast.    1   lisinopril-hydrochlorothiazide (PRINZIDE,ZESTORETIC) 20-25 MG tablet Take 1 tablet by mouth daily.  4   NOVOLOG  100 UNIT/ML injection Inject into the skin continuous. Adjust insulin  pump according to carb intake  2   pantoprazole  (PROTONIX ) 40 MG tablet Take 40 mg by mouth daily.     pregabalin (LYRICA) 75 MG capsule Take 75 mg by mouth daily.     ticagrelor  (BRILINTA ) 90 MG TABS tablet Take 1 tablet (90 mg total) by mouth 2 (two) times daily. 60 tablet 0   TRULICITY 4.5 MG/0.5ML SOAJ Inject 4.5 mg into the skin once a week.     metoprolol  succinate (TOPROL -XL) 25 MG 24 hr tablet Take 0.5 tablets (12.5 mg total) by mouth daily. Take with or immediately following a meal. (Patient not taking: Reported on 01/28/2024) 45 tablet 3   ondansetron  (ZOFRAN -ODT) 4 MG disintegrating tablet Take 1 tablet (4 mg total) by mouth every 8 (eight) hours as needed for nausea or vomiting. (Patient not taking: Reported on 01/28/2024) 20 tablet 0   No current facility-administered medications for this visit.    Allergies:   Morphine and codeine    Social History:  The patient  reports that she has never smoked. She has never used smokeless tobacco. She reports that she does not drink alcohol and does not use drugs.   Family History:  The patient's family history includes Diabetes in her brother, mother, and sister; Heart Problems in her mother; Heart disease in her mother.    ROS:  Please see the history of present illness.   Otherwise, review of systems are positive for none.   All other systems are reviewed and negative.    PHYSICAL EXAM: VS:  BP (!) 140/80 (BP Location: Left Arm, Patient Position: Sitting, Cuff Size: Large)   Pulse 69   Ht 5' 2 (1.575 m)   Wt 225 lb 6 oz (102.2 kg)   SpO2 98%   BMI 41.22 kg/m  , BMI Body mass index is 41.22 kg/m. GEN: Well nourished, well developed, in no acute distress  HEENT: normal  Neck: no JVD, carotid bruits, or masses Cardiac: RRR; no murmurs, rubs, or  gallops,no edema  Respiratory:  clear to auscultation bilaterally, normal work of breathing GI: soft, nontender, nondistended, + BS MS: no deformity or atrophy  Skin: warm and dry, no rash Neuro:  Strength and sensation are intact Psych: euthymic mood, full affect   EKG:  EKG is not ordered today. Recent EKG was reviewed and showed sinus rhythm with poor R wave progression in the anterior leads.   Recent Labs: 07/20/2023: ALT 20 08/26/2023: Magnesium 2.1; TSH 0.133 01/20/2024: BUN 25; Creatinine, Ser 1.21; Hemoglobin 12.9; Platelets 266; Potassium 3.9; Sodium 138    Lipid Panel    Component Value Date/Time   CHOL 112 11/12/2023 0824   TRIG 94 11/12/2023 0824   HDL 39 (L) 11/12/2023 0824   CHOLHDL 2.9 11/12/2023 0824   CHOLHDL 2.7 09/28/2023 0446   VLDL 21 09/28/2023 0446  LDLCALC 55 11/12/2023 0824      Wt Readings from Last 3 Encounters:  01/28/24 225 lb 6 oz (102.2 kg)  01/20/24 219 lb (99.3 kg)  11/12/23 225 lb (102.1 kg)          08/26/2023    8:28 AM 08/23/2023    9:16 AM  PAD Screen  Previous PAD dx? No No   Previous surgical procedure? No No   Pain with walking? No Yes   Subsides with rest? No Yes   Feet/toe relief with dangling? No Yes   Painful, non-healing ulcers? No No   Extremities discolored? No Yes      Manually entered by patient      ASSESSMENT AND PLAN:  1.  Coronary artery disease involving native coronary arteries with unstable angina: She had significant improvement in her symptoms in September after RCA PCI.  However, she now reports recurrent chest pain with minimal exertion and at rest very similar to her symptoms before PCI.  Distal RCA stent is high risk for restenosis given bifurcation location.  In addition, there was 70% stenosis in the proximal LAD that was not revascularized. Given her symptoms and known disease, recommend proceeding with urgent left heart catheterization possible PCI.  I discussed the procedure in details as well  as risks and benefits. She had severe radial artery spasm during the procedure and we will plan on femoral access.  Will also have to make sure to give her enough sedation.  2.  Recurrent syncope: Seems to be suggestive of orthostatic hypotension but she is not orthostatic today.  If she keeps having these episodes after full revascularization, will refer to EP for a loop recorder.  3.  Possible atrial fibrillation: I reviewed prior outpatient monitors.  No convincing evidence of atrial fibrillation.  Episodes were consistent with short runs of SVT.  4.  Hyperlipidemia: Continue atorvastatin  40 mg once daily.  Most recent lipid profile showed an LDL of 55.  5.  Essential hypertension: Blood pressure is controlled.    Disposition:   Proceed with left heart catheterization and follow-up after.  Signed,  Deatrice Cage, MD  01/28/2024 1:59 PM    [facility] "

## 2024-01-28 NOTE — Patient Instructions (Addendum)
 Medication Instructions:  No changes *If you need a refill on your cardiac medications before your next appointment, please call your pharmacy*  Follow-Up: At [facility], you and your health needs are our priority.  As part of our continuing mission to provide you with exceptional heart care, our providers are all part of one team.  This team includes your primary Cardiologist (physician) and Advanced Practice Providers or APPs (Physician Assistants and Nurse Practitioners) who all work together to provide you with the care you need, when you need it.  Your next appointment:   1 month(s)  Provider:   You may see Dr. Darron or one of the following Advanced Practice Providers on your designated Care Team:   Lonni Meager, NP Lesley Maffucci, PA-C Bernardino Bring, PA-C Cadence Wheaton, PA-C Tylene Lunch, NP Barnie Hila, NP    We recommend signing up for the patient portal called MyChart.  Sign up information is provided on this After Visit Summary.  MyChart is used to connect with patients for Virtual Visits (Telemedicine).  Patients are able to view lab/test results, encounter notes, upcoming appointments, etc.  Non-urgent messages can be sent to your provider as well.   To learn more about what you can do with MyChart, go to [email or web address].   Other Instructions  [facility] A DEPT OF [facility] [address] [place], [address] [place] [address] Dept: [phone] Loc: [phone]  Jennifer Lozano  01/28/2024  You are scheduled for a Cardiac Catheterization on Monday, January 26 with Dr. Deatrice Darron.  1. Please arrive at the [facility] Entrance of [facility], [address], [place] [address] at 9:30 AM (This is 1 hour(s) prior to your procedure time).  Proceed to the Check-In Desk directly inside the entrance.  Procedure Parking: Use the entrance off of the [facility] [address] side of the hospital. Turn right upon entering and follow the driveway to parking that is directly in front of the [facility]ascular Center. There is no valet parking available at this entrance, however there is an awning directly in front of the Heart & Vascular Center for drop off/ pick up for patients.  Special note: Every effort is made to have your procedure done on time. Please understand that emergencies sometimes delay scheduled procedures.  2. Diet: Nothing to eat after midnight.  3. Hydration: You need to be well hydrated before your procedure. On January 26, you may drink approved liquids (see below) until 2 hours before the procedure, with 16 oz of water  as your last intake.   List of approved liquids water , clear juice, clear tea, black coffee, fruit juices, non-citric and without pulp, carbonated beverages, Gatorade, Kool -Aid, plain Jello-O and plain ice popsicles.  4. Labs: Completed on 01/20/24  5. Medication instructions in preparation for your procedure: Hold all diabetic medication the morning of the procedure  -take half the dose of the lantus the night before.  -hold the lisinopril-hydrochlorothiazide the morning of.   On the morning of your procedure, take your Aspirin  81 mg and any morning medicines NOT listed above.  You may use sips of water .  6. Plan to go home the same day, you will only stay overnight if medically necessary. 7. Bring a current list of your medications and current insurance cards. 8. You MUST have a responsible person to drive you home. 9. Someone MUST be with you the first 24 hours after you  arrive home or your discharge will be delayed. 10. Please wear clothes that are easy to get on and off and wear slip-on shoes.  Thank you for allowing us  to care for you!   -- [place] [facility]

## 2024-01-28 NOTE — Progress Notes (Signed)
 "    Cardiology Office Note   Date:  01/28/2024   ID:  Jennifer Lozano, DOB 06-22-1965, MRN [id number]  PCP:  [place] HERO, MD  Cardiologist:   Deatrice Cage, MD   Chief Complaint  Patient presents with   Follow-up    F/u ER chest pain/sob c/o intermittent chest pain/ syncope and sob. Meds reviewed verbally with pt.      History of Present Illness: Jennifer Lozano is a 59 y.o. female who presents for a follow-up visit regarding coronary artery disease. She has known history of diabetes mellitus, asthma, hyperlipidemia and hypertension. She was seen in the ER in July 2025 with syncope after 3 days of nausea, vomiting and diarrhea.  Labs showed evidence of volume depletion.  Outpatient monitor showed short runs of SVT.  She was started on small dose Toprol .  Cardiac CTA showed a calcium  score of 248 with two-vessel coronary artery disease. Cardiac catheterization was done in September 2025 which showed significant two-vessel coronary artery disease, normal LV systolic function and mildly elevated left ventricular end-diastolic pressure.  I performed successful complex bifurcation angioplasty and drug-eluting stent placement to the distal RCA with stent extending into the AV groove branch.  Right PDA was ballooned without stent placement.  There was also a 70% stenosis in the mid LAD.  Echocardiogram showed normal LV systolic function.  She started having chest pain again 2 weeks ago described as heaviness and aching sensation that is happening with exertion and sometimes at rest.  She has significant exertional dyspnea as well and had a syncopal episode last week when she was trying to pick up a bag of dog food.  She went to the emergency room on January 12.  EKG showed no ischemic changes and troponin was normal.   Past Medical History:  Diagnosis Date   Asthma    COVID-19 07/15/2019   diagnosed in outpatient clinic on 07/15/2019   Diabetes mellitus without complication (HCC)    History  of hiatal hernia    Hypertension    PONV (postoperative nausea and vomiting)     Past Surgical History:  Procedure Laterality Date   ABDOMINAL HYSTERECTOMY     CARPAL TUNNEL RELEASE Right 10/08/2018   Procedure: CARPAL TUNNEL RELEASE ENDOSCOPIC;  Surgeon: Edie Norleen PARAS, MD;  Location: [facility] ORS;  Service: Orthopedics;  Laterality: Right;   CARPAL TUNNEL RELEASE Left 11/05/2018   Procedure: CARPAL TUNNEL RELEASE ENDOSCOPIC;  Surgeon: Edie Norleen PARAS, MD;  Location: [facility] ORS;  Service: Orthopedics;  Laterality: Left;   CATARACT EXTRACTION, BILATERAL     CHOLECYSTECTOMY     COLONOSCOPY WITH PROPOFOL  N/A 04/15/2017   Procedure: COLONOSCOPY WITH PROPOFOL ;  Surgeon: Unk Corinn Skiff, MD;  Location: [facility] ENDOSCOPY;  Service: Gastroenterology;  Laterality: N/A;   CORONARY STENT INTERVENTION N/A 09/30/2023   Procedure: CORONARY STENT INTERVENTION;  Surgeon: Cage Deatrice LABOR, MD;  Location: [facility] INVASIVE CV LAB;  Service: Cardiovascular;  Laterality: N/A;   DORSAL COMPARTMENT RELEASE Right 10/08/2018   Procedure: DE QUERVAIN'S TENOSYNOVITIS RELEASE;  Surgeon: Edie Norleen PARAS, MD;  Location: [facility] ORS;  Service: Orthopedics;  Laterality: Right;   LEFT HEART CATH AND CORONARY ANGIOGRAPHY N/A 09/30/2023   Procedure: LEFT HEART CATH AND CORONARY ANGIOGRAPHY;  Surgeon: Cage Deatrice LABOR, MD;  Location: [facility] INVASIVE CV LAB;  Service: Cardiovascular;  Laterality: N/A;   TONSILLECTOMY       Current Outpatient Medications  Medication Sig Dispense Refill   albuterol (VENTOLIN HFA) 108 (90 Base) MCG/ACT inhaler Inhale  2 puffs into the lungs.     amLODipine  (NORVASC ) 5 MG tablet Take 5 mg by mouth daily.     atorvastatin  (LIPITOR) 40 MG tablet Take 40 mg by mouth daily.     Continuous Glucose Sensor (DEXCOM G7 SENSOR) MISC 1 each.     LANTUS SOLOSTAR 100 UNIT/ML Solostar Pen Inject 40 Units into the skin daily.     levothyroxine (SYNTHROID, LEVOTHROID) 125 MCG tablet Take 125 mcg by mouth daily before breakfast.    1   lisinopril-hydrochlorothiazide (PRINZIDE,ZESTORETIC) 20-25 MG tablet Take 1 tablet by mouth daily.  4   NOVOLOG  100 UNIT/ML injection Inject into the skin continuous. Adjust insulin  pump according to carb intake  2   pantoprazole  (PROTONIX ) 40 MG tablet Take 40 mg by mouth daily.     pregabalin (LYRICA) 75 MG capsule Take 75 mg by mouth daily.     ticagrelor  (BRILINTA ) 90 MG TABS tablet Take 1 tablet (90 mg total) by mouth 2 (two) times daily. 60 tablet 0   TRULICITY 4.5 MG/0.5ML SOAJ Inject 4.5 mg into the skin once a week.     metoprolol  succinate (TOPROL -XL) 25 MG 24 hr tablet Take 0.5 tablets (12.5 mg total) by mouth daily. Take with or immediately following a meal. (Patient not taking: Reported on 01/28/2024) 45 tablet 3   ondansetron  (ZOFRAN -ODT) 4 MG disintegrating tablet Take 1 tablet (4 mg total) by mouth every 8 (eight) hours as needed for nausea or vomiting. (Patient not taking: Reported on 01/28/2024) 20 tablet 0   No current facility-administered medications for this visit.    Allergies:   Morphine and codeine    Social History:  The patient  reports that she has never smoked. She has never used smokeless tobacco. She reports that she does not drink alcohol and does not use drugs.   Family History:  The patient's family history includes Diabetes in her brother, mother, and sister; Heart Problems in her mother; Heart disease in her mother.    ROS:  Please see the history of present illness.   Otherwise, review of systems are positive for none.   All other systems are reviewed and negative.    PHYSICAL EXAM: VS:  BP (!) 140/80 (BP Location: Left Arm, Patient Position: Sitting, Cuff Size: Large)   Pulse 69   Ht 5' 2 (1.575 m)   Wt 225 lb 6 oz (102.2 kg)   SpO2 98%   BMI 41.22 kg/m  , BMI Body mass index is 41.22 kg/m. GEN: Well nourished, well developed, in no acute distress  HEENT: normal  Neck: no JVD, carotid bruits, or masses Cardiac: RRR; no murmurs, rubs, or  gallops,no edema  Respiratory:  clear to auscultation bilaterally, normal work of breathing GI: soft, nontender, nondistended, + BS MS: no deformity or atrophy  Skin: warm and dry, no rash Neuro:  Strength and sensation are intact Psych: euthymic mood, full affect   EKG:  EKG is not ordered today. Recent EKG was reviewed and showed sinus rhythm with poor R wave progression in the anterior leads.   Recent Labs: 07/20/2023: ALT 20 08/26/2023: Magnesium 2.1; TSH 0.133 01/20/2024: BUN 25; Creatinine, Ser 1.21; Hemoglobin 12.9; Platelets 266; Potassium 3.9; Sodium 138    Lipid Panel    Component Value Date/Time   CHOL 112 11/12/2023 0824   TRIG 94 11/12/2023 0824   HDL 39 (L) 11/12/2023 0824   CHOLHDL 2.9 11/12/2023 0824   CHOLHDL 2.7 09/28/2023 0446   VLDL 21 09/28/2023 0446  LDLCALC 55 11/12/2023 0824      Wt Readings from Last 3 Encounters:  01/28/24 225 lb 6 oz (102.2 kg)  01/20/24 219 lb (99.3 kg)  11/12/23 225 lb (102.1 kg)          08/26/2023    8:28 AM 08/23/2023    9:16 AM  PAD Screen  Previous PAD dx? No No   Previous surgical procedure? No No   Pain with walking? No Yes   Subsides with rest? No Yes   Feet/toe relief with dangling? No Yes   Painful, non-healing ulcers? No No   Extremities discolored? No Yes      Manually entered by patient      ASSESSMENT AND PLAN:  1.  Coronary artery disease involving native coronary arteries with unstable angina: She had significant improvement in her symptoms in September after RCA PCI.  However, she now reports recurrent chest pain with minimal exertion and at rest very similar to her symptoms before PCI.  Distal RCA stent is high risk for restenosis given bifurcation location.  In addition, there was 70% stenosis in the proximal LAD that was not revascularized. Given her symptoms and known disease, recommend proceeding with urgent left heart catheterization possible PCI.  I discussed the procedure in details as well  as risks and benefits. She had severe radial artery spasm during the procedure and we will plan on femoral access.  Will also have to make sure to give her enough sedation.  2.  Recurrent syncope: Seems to be suggestive of orthostatic hypotension but she is not orthostatic today.  If she keeps having these episodes after full revascularization, will refer to EP for a loop recorder.  3.  Possible atrial fibrillation: I reviewed prior outpatient monitors.  No convincing evidence of atrial fibrillation.  Episodes were consistent with short runs of SVT.  4.  Hyperlipidemia: Continue atorvastatin  40 mg once daily.  Most recent lipid profile showed an LDL of 55.  5.  Essential hypertension: Blood pressure is controlled.    Disposition:   Proceed with left heart catheterization and follow-up after.  Signed,  Deatrice Cage, MD  01/28/2024 1:59 PM    [facility] "

## 2024-01-30 ENCOUNTER — Telehealth: Payer: Self-pay | Admitting: *Deleted

## 2024-01-30 NOTE — Telephone Encounter (Addendum)
 Cardiac Catheterization scheduled at [facility] for: Monday February 03, 2024 10:30 AM Arrival time [facility] Entrance at: 9:30 AM [address] [place] [address]  Diet: -Nothing to eat after midnight.  Hydration: -May drink clear liquids until 2 hours (8:30 AM) before the procedure.  Approved liquids: Water , clear tea, black coffee, fruit juices-non-citric and without pulp,Gatorade, plain Jello/popsicles.   -Please drink 16 oz of water  2 hours before procedure.  Medication instructions: -Hold:  Insulin -AM of procedure 1/2 usual Insulin  dose HS prior to procedure  Lisinopril-hydrochlorothiazide-day before and day of procedure-per protocol GFR < 60 (51)  Trulicity weekly on Sunday evenings-pt tells me she will hold until after procedure -Other usual morning medications can be taken including aspirin  81 mg and Brilinta  90 mg.  Plan to go home the same day, you will only stay overnight if medically necessary.  You must have responsible adult to drive you home.  Someone must be with you the first 24 hours after you arrive home.  Reviewed procedure instructions with patient. Patient tells me she is aware of possible inclement weather 02/03/24 and plans to proceed with cath.

## 2024-02-03 ENCOUNTER — Encounter: Payer: Self-pay | Admitting: Cardiovascular Disease

## 2024-02-03 ENCOUNTER — Encounter: Admission: RE | Payer: Self-pay | Source: Home / Self Care

## 2024-02-03 ENCOUNTER — Ambulatory Visit

## 2024-02-03 ENCOUNTER — Ambulatory Visit
Admission: RE | Admit: 2024-02-03 | Discharge: 2024-02-04 | Disposition: A | Attending: Cardiovascular Disease | Admitting: Cardiovascular Disease

## 2024-02-03 ENCOUNTER — Other Ambulatory Visit: Payer: Self-pay

## 2024-02-03 DIAGNOSIS — Z79899 Other long term (current) drug therapy: Secondary | ICD-10-CM | POA: Insufficient documentation

## 2024-02-03 DIAGNOSIS — Z6841 Body Mass Index (BMI) 40.0 and over, adult: Secondary | ICD-10-CM | POA: Diagnosis not present

## 2024-02-03 DIAGNOSIS — Z7982 Long term (current) use of aspirin: Secondary | ICD-10-CM | POA: Insufficient documentation

## 2024-02-03 DIAGNOSIS — I10 Essential (primary) hypertension: Secondary | ICD-10-CM | POA: Diagnosis present

## 2024-02-03 DIAGNOSIS — R55 Syncope and collapse: Secondary | ICD-10-CM | POA: Diagnosis not present

## 2024-02-03 DIAGNOSIS — E119 Type 2 diabetes mellitus without complications: Secondary | ICD-10-CM | POA: Diagnosis not present

## 2024-02-03 DIAGNOSIS — I700 Atherosclerosis of aorta: Secondary | ICD-10-CM | POA: Diagnosis not present

## 2024-02-03 DIAGNOSIS — Z7902 Long term (current) use of antithrombotics/antiplatelets: Secondary | ICD-10-CM | POA: Insufficient documentation

## 2024-02-03 DIAGNOSIS — R109 Unspecified abdominal pain: Secondary | ICD-10-CM | POA: Diagnosis not present

## 2024-02-03 DIAGNOSIS — Z794 Long term (current) use of insulin: Secondary | ICD-10-CM | POA: Diagnosis not present

## 2024-02-03 DIAGNOSIS — Z955 Presence of coronary angioplasty implant and graft: Secondary | ICD-10-CM | POA: Insufficient documentation

## 2024-02-03 DIAGNOSIS — E785 Hyperlipidemia, unspecified: Secondary | ICD-10-CM | POA: Diagnosis present

## 2024-02-03 DIAGNOSIS — I2089 Other forms of angina pectoris: Secondary | ICD-10-CM | POA: Diagnosis not present

## 2024-02-03 DIAGNOSIS — Z7985 Long-term (current) use of injectable non-insulin antidiabetic drugs: Secondary | ICD-10-CM | POA: Insufficient documentation

## 2024-02-03 DIAGNOSIS — I200 Unstable angina: Secondary | ICD-10-CM | POA: Diagnosis present

## 2024-02-03 DIAGNOSIS — I25110 Atherosclerotic heart disease of native coronary artery with unstable angina pectoris: Secondary | ICD-10-CM | POA: Diagnosis not present

## 2024-02-03 DIAGNOSIS — I25118 Atherosclerotic heart disease of native coronary artery with other forms of angina pectoris: Secondary | ICD-10-CM | POA: Diagnosis not present

## 2024-02-03 LAB — CBC WITH DIFFERENTIAL/PLATELET
Abs Immature Granulocytes: 0.04 10*3/uL (ref 0.00–0.07)
Basophils Absolute: 0.1 10*3/uL (ref 0.0–0.1)
Basophils Relative: 1 %
Eosinophils Absolute: 0.2 10*3/uL (ref 0.0–0.5)
Eosinophils Relative: 2 %
HCT: 35.5 % — ABNORMAL LOW (ref 36.0–46.0)
Hemoglobin: 11.6 g/dL — ABNORMAL LOW (ref 12.0–15.0)
Immature Granulocytes: 0 %
Lymphocytes Relative: 24 %
Lymphs Abs: 2.5 10*3/uL (ref 0.7–4.0)
MCH: 30.2 pg (ref 26.0–34.0)
MCHC: 32.7 g/dL (ref 30.0–36.0)
MCV: 92.4 fL (ref 80.0–100.0)
Monocytes Absolute: 0.7 10*3/uL (ref 0.1–1.0)
Monocytes Relative: 7 %
Neutro Abs: 6.7 10*3/uL (ref 1.7–7.7)
Neutrophils Relative %: 66 %
Platelets: 264 10*3/uL (ref 150–400)
RBC: 3.84 MIL/uL — ABNORMAL LOW (ref 3.87–5.11)
RDW: 12.9 % (ref 11.5–15.5)
WBC: 10.2 10*3/uL (ref 4.0–10.5)
nRBC: 0 % (ref 0.0–0.2)

## 2024-02-03 LAB — BASIC METABOLIC PANEL WITH GFR
Anion gap: 9 (ref 5–15)
BUN: 19 mg/dL (ref 6–20)
CO2: 24 mmol/L (ref 22–32)
Calcium: 8.4 mg/dL — ABNORMAL LOW (ref 8.9–10.3)
Chloride: 104 mmol/L (ref 98–111)
Creatinine, Ser: 1.09 mg/dL — ABNORMAL HIGH (ref 0.44–1.00)
GFR, Estimated: 58 mL/min — ABNORMAL LOW
Glucose, Bld: 151 mg/dL — ABNORMAL HIGH (ref 70–99)
Potassium: 4.4 mmol/L (ref 3.5–5.1)
Sodium: 137 mmol/L (ref 135–145)

## 2024-02-03 LAB — GLUCOSE, CAPILLARY
Glucose-Capillary: 128 mg/dL — ABNORMAL HIGH (ref 70–99)
Glucose-Capillary: 119 mg/dL — ABNORMAL HIGH (ref 70–99)
Glucose-Capillary: 194 mg/dL — ABNORMAL HIGH (ref 70–99)
Glucose-Capillary: 169 mg/dL — ABNORMAL HIGH (ref 70–99)

## 2024-02-03 LAB — TYPE AND SCREEN
ABO/RH(D): O POS
Antibody Screen: NEGATIVE

## 2024-02-03 LAB — POCT ACTIVATED CLOTTING TIME
Activated Clotting Time: 337 s
Activated Clotting Time: 281 s
Activated Clotting Time: 619 s

## 2024-02-03 LAB — PROTIME-INR
INR: 1.1 (ref 0.8–1.2)
Prothrombin Time: 14.7 s (ref 11.4–15.2)

## 2024-02-03 MED ORDER — MIDAZOLAM HCL 2 MG/2ML IJ SOLN
INTRAMUSCULAR | Status: AC
  Filled 2024-02-03: qty 2

## 2024-02-03 MED ORDER — SODIUM CHLORIDE 0.9 % IV SOLN
INTRAVENOUS | Status: DC | PRN
  Administered 2024-02-03: 100 mL/h via INTRAVENOUS

## 2024-02-03 MED ORDER — LISINOPRIL 20 MG PO TABS
20.0000 mg | ORAL_TABLET | Freq: Every day | ORAL | Status: DC
  Administered 2024-02-04: 20 mg via ORAL
  Filled 2024-02-03: qty 1

## 2024-02-03 MED ORDER — INSULIN LISPRO (1 UNIT DIAL) 100 UNIT/ML (KWIKPEN): 10.0000 [IU] | PEN_INJECTOR | Freq: Three times a day (TID) | SUBCUTANEOUS | Status: DC

## 2024-02-03 MED ORDER — MIDAZOLAM HCL (PF) 2 MG/2ML IJ SOLN
INTRAMUSCULAR | Status: DC | PRN
  Administered 2024-02-03: 2 mg via INTRAVENOUS
  Administered 2024-02-03: 1 mg via INTRAVENOUS

## 2024-02-03 MED ORDER — NITROGLYCERIN 1 MG/10 ML FOR IR/CATH LAB
INTRA_ARTERIAL | Status: AC
  Filled 2024-02-03: qty 10

## 2024-02-03 MED ORDER — INSULIN ASPART 100 UNIT/ML IJ SOLN
0.0000 [IU] | Freq: Three times a day (TID) | INTRAMUSCULAR | Status: DC
  Administered 2024-02-03: 3 [IU] via SUBCUTANEOUS
  Administered 2024-02-04 (×2): 2 [IU] via SUBCUTANEOUS
  Filled 2024-02-03 (×2): qty 2
  Filled 2024-02-03: qty 3

## 2024-02-03 MED ORDER — HEPARIN (PORCINE) IN NACL 1000-0.9 UT/500ML-% IV SOLN
INTRAVENOUS | Status: AC
  Filled 2024-02-03: qty 1000

## 2024-02-03 MED ORDER — FREE WATER
500.0000 mL | Freq: Once | Status: AC
  Administered 2024-02-03: 500 mL via ORAL

## 2024-02-03 MED ORDER — FENTANYL CITRATE (PF) 100 MCG/2ML IJ SOLN
INTRAMUSCULAR | Status: AC
  Filled 2024-02-03: qty 2

## 2024-02-03 MED ORDER — TICAGRELOR 90 MG PO TABS
ORAL_TABLET | ORAL | Status: AC
  Filled 2024-02-03: qty 1

## 2024-02-03 MED ORDER — SODIUM CHLORIDE 0.9 % IV SOLN: Freq: Once | INTRAVENOUS | Status: AC

## 2024-02-03 MED ORDER — NOREPINEPHRINE 4 MG/250ML-% IV SOLN
INTRAVENOUS | Status: AC
  Filled 2024-02-03: qty 250

## 2024-02-03 MED ORDER — LIDOCAINE HCL 1 % IJ SOLN
INTRAMUSCULAR | Status: AC
  Filled 2024-02-03: qty 20

## 2024-02-03 MED ORDER — IOHEXOL 300 MG/ML  SOLN
INTRAMUSCULAR | Status: DC | PRN
  Administered 2024-02-03: 222 mL

## 2024-02-03 MED ORDER — ONDANSETRON 4 MG PO TBDP: 4.0000 mg | ORAL_TABLET | Freq: Three times a day (TID) | ORAL | Status: DC | PRN

## 2024-02-03 MED ORDER — SODIUM CHLORIDE 0.9% FLUSH: 3.0000 mL | INTRAVENOUS | Status: DC | PRN

## 2024-02-03 MED ORDER — FENTANYL CITRATE (PF) 100 MCG/2ML IJ SOLN
INTRAMUSCULAR | Status: DC | PRN
  Administered 2024-02-03: 50 ug via INTRAVENOUS
  Administered 2024-02-03: 25 ug via INTRAVENOUS

## 2024-02-03 MED ORDER — METOPROLOL SUCCINATE ER 25 MG PO TB24
12.5000 mg | ORAL_TABLET | Freq: Every day | ORAL | Status: DC
  Administered 2024-02-03 – 2024-02-04 (×2): 12.5 mg via ORAL
  Filled 2024-02-03 (×2): qty 1

## 2024-02-03 MED ORDER — TICAGRELOR 90 MG PO TABS
90.0000 mg | ORAL_TABLET | Freq: Two times a day (BID) | ORAL | Status: DC
  Administered 2024-02-03 – 2024-02-04 (×2): 90 mg via ORAL
  Filled 2024-02-03 (×2): qty 1

## 2024-02-03 MED ORDER — LEVOTHYROXINE SODIUM 112 MCG PO TABS
112.0000 ug | ORAL_TABLET | Freq: Every day | ORAL | Status: DC
  Administered 2024-02-04: 112 ug via ORAL
  Filled 2024-02-03: qty 1

## 2024-02-03 MED ORDER — PREGABALIN 75 MG PO CAPS
75.0000 mg | ORAL_CAPSULE | Freq: Every day | ORAL | Status: DC
  Administered 2024-02-04: 75 mg via ORAL
  Filled 2024-02-03: qty 1

## 2024-02-03 MED ORDER — AMLODIPINE BESYLATE 5 MG PO TABS
5.0000 mg | ORAL_TABLET | Freq: Every day | ORAL | Status: DC
  Administered 2024-02-04: 5 mg via ORAL
  Filled 2024-02-03: qty 1

## 2024-02-03 MED ORDER — SODIUM CHLORIDE 0.9 % IV SOLN: 250.0000 mL | INTRAVENOUS | Status: AC | PRN

## 2024-02-03 MED ORDER — HEPARIN SODIUM (PORCINE) 1000 UNIT/ML IJ SOLN
INTRAMUSCULAR | Status: DC | PRN
  Administered 2024-02-03: 10000 [IU] via INTRAVENOUS

## 2024-02-03 MED ORDER — ATROPINE SULFATE 1 MG/10ML IJ SOSY
PREFILLED_SYRINGE | INTRAMUSCULAR | Status: AC
  Filled 2024-02-03: qty 10

## 2024-02-03 MED ORDER — SODIUM CHLORIDE 0.9% FLUSH
3.0000 mL | Freq: Two times a day (BID) | INTRAVENOUS | Status: DC
  Administered 2024-02-03 – 2024-02-04 (×3): 3 mL via INTRAVENOUS

## 2024-02-03 MED ORDER — HEPARIN (PORCINE) IN NACL 2000-0.9 UNIT/L-% IV SOLN
INTRAVENOUS | Status: DC | PRN
  Administered 2024-02-03: 1000 mL

## 2024-02-03 MED ORDER — ATROPINE SULFATE 1 MG/10ML IJ SOSY
1.0000 mg | PREFILLED_SYRINGE | Freq: Once | INTRAMUSCULAR | Status: AC
  Administered 2024-02-03: 1 mg via INTRAVENOUS

## 2024-02-03 MED ORDER — SODIUM CHLORIDE 0.9% FLUSH
3.0000 mL | Freq: Two times a day (BID) | INTRAVENOUS | Status: DC
  Administered 2024-02-03: 3 mL via INTRAVENOUS

## 2024-02-03 MED ORDER — LIDOCAINE HCL (PF) 1 % IJ SOLN
INTRAMUSCULAR | Status: DC | PRN
  Administered 2024-02-03: 5 mL via SUBCUTANEOUS

## 2024-02-03 MED ORDER — HEPARIN SODIUM (PORCINE) 1000 UNIT/ML IJ SOLN
INTRAMUSCULAR | Status: AC
  Filled 2024-02-03: qty 10

## 2024-02-03 MED ORDER — NITROGLYCERIN 1 MG/10 ML FOR IR/CATH LAB
INTRA_ARTERIAL | Status: DC | PRN
  Administered 2024-02-03 (×2): 200 ug via INTRACORONARY

## 2024-02-03 MED ORDER — ONDANSETRON HCL 4 MG/2ML IJ SOLN: 4.0000 mg | Freq: Four times a day (QID) | INTRAMUSCULAR | Status: DC | PRN

## 2024-02-03 MED ORDER — NOREPINEPHRINE 4 MG/250ML-% IV SOLN
0.0000 ug/min | INTRAVENOUS | Status: DC
  Administered 2024-02-03: 5 ug/min via INTRAVENOUS

## 2024-02-03 MED ORDER — ASPIRIN 81 MG PO CHEW: 81.0000 mg | CHEWABLE_TABLET | ORAL | Status: DC

## 2024-02-03 MED ORDER — HYDROCHLOROTHIAZIDE 25 MG PO TABS
25.0000 mg | ORAL_TABLET | Freq: Every day | ORAL | Status: DC
  Administered 2024-02-04: 25 mg via ORAL
  Filled 2024-02-03: qty 1

## 2024-02-03 MED ORDER — LISINOPRIL-HYDROCHLOROTHIAZIDE 20-25 MG PO TABS: 1.0000 | ORAL_TABLET | Freq: Every day | ORAL | Status: DC

## 2024-02-03 MED ORDER — INSULIN GLARGINE 100 UNIT/ML ~~LOC~~ SOLN
40.0000 [IU] | Freq: Every day | SUBCUTANEOUS | Status: DC
  Administered 2024-02-03: 40 [IU] via SUBCUTANEOUS
  Filled 2024-02-03 (×2): qty 0.4

## 2024-02-03 MED ORDER — SODIUM CHLORIDE 0.9 % IV SOLN
250.0000 mL | INTRAVENOUS | Status: DC | PRN
  Administered 2024-02-03: 250 mL via INTRAVENOUS

## 2024-02-03 MED ORDER — PANTOPRAZOLE SODIUM 40 MG PO TBEC
40.0000 mg | DELAYED_RELEASE_TABLET | Freq: Every day | ORAL | Status: DC
  Administered 2024-02-03 – 2024-02-04 (×2): 40 mg via ORAL
  Filled 2024-02-03 (×2): qty 1

## 2024-02-03 MED ORDER — ASPIRIN 81 MG PO TBEC
81.0000 mg | DELAYED_RELEASE_TABLET | Freq: Every day | ORAL | Status: DC
  Administered 2024-02-04: 81 mg via ORAL
  Filled 2024-02-03: qty 1

## 2024-02-03 MED ORDER — ACETAMINOPHEN 325 MG PO TABS
650.0000 mg | ORAL_TABLET | ORAL | Status: DC | PRN
  Administered 2024-02-03: 650 mg via ORAL
  Filled 2024-02-03: qty 2

## 2024-02-03 MED ORDER — ATORVASTATIN CALCIUM 20 MG PO TABS
40.0000 mg | ORAL_TABLET | Freq: Every day | ORAL | Status: DC
  Administered 2024-02-03 – 2024-02-04 (×2): 40 mg via ORAL
  Filled 2024-02-03 (×2): qty 2

## 2024-02-03 MED ORDER — TICAGRELOR 90 MG PO TABS
ORAL_TABLET | ORAL | Status: DC | PRN
  Administered 2024-02-03: 90 mg via ORAL

## 2024-02-03 MED ORDER — SODIUM CHLORIDE 0.9 % IV SOLN: INTRAVENOUS | Status: AC

## 2024-02-03 MED ORDER — HEPARIN (PORCINE) IN NACL 1000-0.9 UT/500ML-% IV SOLN
INTRAVENOUS | Status: DC | PRN
  Administered 2024-02-03: 500 mL

## 2024-02-03 NOTE — Progress Notes (Signed)
 I was called post PCI due to hypotension, bradycardia and discomfort in the right groin access area.  There was a small hematoma and manual pressure was held.  Blood pressure was 60/40 and heart rate was in the 40s.  I suspected a vagal response.  1 mg of IV atropine  was given and IV fluids was given wide open.  Blood pressure started to improve did not normalize.  I started small dose norepinephrine  drip continued manual pressure in the right groin area for about 20 to 30 minutes. Stat labs were sent which showed a drop in hemoglobin from 12.9 to 11.6. A stat CT abdomen was performed which showed hemorrhagic stranding along the right groin area extending proximally and distally but no evidence of retroperitoneal bleed. Blood pressure improved quickly and norepinephrine  drip was stopped after less than 1 hour.  The patient felt back to baseline. The patient had no chest pain throughout all of this.  We did an EKG which showed no evidence of ischemia.  Suspect that she had a small groin hematoma leading to a vasovagal reaction.  Will continue IV fluids overnight and monitor. I rechecked on the patient at 4 PM and she was feeling back to baseline with normal vitals and no clear groin hematoma.  CRITICAL CARE Performed by: Deatrice Cage   Total critical care time: 30 minutes  Critical care time was exclusive of separately billable procedures and treating other patients.  Critical care was necessary to treat or prevent imminent or life-threatening deterioration.  Critical care was time spent personally by me on the following activities: development of treatment plan with patient and/or surrogate as well as nursing, discussions with consultants, evaluation of patient's response to treatment, examination of patient, obtaining history from patient or surrogate, ordering and performing treatments and interventions, ordering and review of laboratory studies, ordering and review of radiographic studies,  pulse oximetry and re-evaluation of patient's condition.

## 2024-02-03 NOTE — Interval H&P Note (Signed)
 History and Physical Interval Note:  02/03/2024 9:41 AM  Jennifer Lozano  has presented today for surgery, with the diagnosis of L Cath   Unstable angina.  The various methods of treatment have been discussed with the patient and family. After consideration of risks, benefits and other options for treatment, the patient has consented to  Procedures: LEFT HEART CATH AND CORONARY ANGIOGRAPHY (Left) as a surgical intervention.  The patient's history has been reviewed, patient examined, no change in status, stable for surgery.  I have reviewed the patient's chart and labs.  Questions were answered to the patient's satisfaction.     Alyene Predmore

## 2024-02-04 ENCOUNTER — Encounter: Payer: Self-pay | Admitting: Cardiovascular Disease

## 2024-02-04 DIAGNOSIS — I25110 Atherosclerotic heart disease of native coronary artery with unstable angina pectoris: Secondary | ICD-10-CM

## 2024-02-04 LAB — BASIC METABOLIC PANEL WITH GFR
Anion gap: 8 (ref 5–15)
BUN: 21 mg/dL — ABNORMAL HIGH (ref 6–20)
CO2: 25 mmol/L (ref 22–32)
Calcium: 8.6 mg/dL — ABNORMAL LOW (ref 8.9–10.3)
Chloride: 105 mmol/L (ref 98–111)
Creatinine, Ser: 1.22 mg/dL — ABNORMAL HIGH (ref 0.44–1.00)
GFR, Estimated: 51 mL/min — ABNORMAL LOW
Glucose, Bld: 189 mg/dL — ABNORMAL HIGH (ref 70–99)
Potassium: 4.2 mmol/L (ref 3.5–5.1)
Sodium: 138 mmol/L (ref 135–145)

## 2024-02-04 LAB — CBC
HCT: 31.1 % — ABNORMAL LOW (ref 36.0–46.0)
HCT: 32.5 % — ABNORMAL LOW (ref 36.0–46.0)
Hemoglobin: 10.4 g/dL — ABNORMAL LOW (ref 12.0–15.0)
Hemoglobin: 11.1 g/dL — ABNORMAL LOW (ref 12.0–15.0)
MCH: 30.2 pg (ref 26.0–34.0)
MCH: 30.7 pg (ref 26.0–34.0)
MCHC: 33.4 g/dL (ref 30.0–36.0)
MCHC: 34.2 g/dL (ref 30.0–36.0)
MCV: 90.4 fL (ref 80.0–100.0)
MCV: 89.8 fL (ref 80.0–100.0)
Platelets: 218 10*3/uL (ref 150–400)
Platelets: 221 10*3/uL (ref 150–400)
RBC: 3.44 MIL/uL — ABNORMAL LOW (ref 3.87–5.11)
RBC: 3.62 MIL/uL — ABNORMAL LOW (ref 3.87–5.11)
RDW: 13 % (ref 11.5–15.5)
RDW: 12.8 % (ref 11.5–15.5)
WBC: 9.5 10*3/uL (ref 4.0–10.5)
WBC: 10.3 10*3/uL (ref 4.0–10.5)
nRBC: 0 % (ref 0.0–0.2)
nRBC: 0 % (ref 0.0–0.2)

## 2024-02-04 LAB — GLUCOSE, CAPILLARY
Glucose-Capillary: 145 mg/dL — ABNORMAL HIGH (ref 70–99)
Glucose-Capillary: 142 mg/dL — ABNORMAL HIGH (ref 70–99)
Glucose-Capillary: 178 mg/dL — ABNORMAL HIGH (ref 70–99)

## 2024-02-04 MED ORDER — LOPERAMIDE HCL 2 MG PO CAPS
2.0000 mg | ORAL_CAPSULE | ORAL | Status: DC | PRN
  Administered 2024-02-04: 2 mg via ORAL
  Filled 2024-02-04: qty 1

## 2024-02-04 NOTE — Plan of Care (Signed)
  Problem: Education: Goal: Understanding of CV disease, CV risk reduction, and recovery process will improve Outcome: Progressing Goal: Individualized Educational Video(s) Outcome: Progressing   Problem: Activity: Goal: Ability to return to baseline activity level will improve Outcome: Progressing   Problem: Cardiovascular: Goal: Ability to achieve and maintain adequate cardiovascular perfusion will improve Outcome: Progressing Goal: Vascular access site(s) Level 0-1 will be maintained Outcome: Progressing   Problem: Health Behavior/Discharge Planning: Goal: Ability to safely manage health-related needs after discharge will improve Outcome: Progressing   Problem: Education: Goal: Knowledge of General Education information will improve Description: Including pain rating scale, medication(s)/side effects and non-pharmacologic comfort measures Outcome: Progressing   Problem: Health Behavior/Discharge Planning: Goal: Ability to manage health-related needs will improve Outcome: Progressing   Problem: Clinical Measurements: Goal: Ability to maintain clinical measurements within normal limits will improve Outcome: Progressing Goal: Will remain free from infection Outcome: Progressing Goal: Diagnostic test results will improve Outcome: Progressing Goal: Respiratory complications will improve Outcome: Progressing Goal: Cardiovascular complication will be avoided Outcome: Progressing   Problem: Activity: Goal: Risk for activity intolerance will decrease Outcome: Progressing   Problem: Nutrition: Goal: Adequate nutrition will be maintained Outcome: Progressing   Problem: Coping: Goal: Level of anxiety will decrease Outcome: Progressing   Problem: Elimination: Goal: Will not experience complications related to bowel motility Outcome: Progressing Goal: Will not experience complications related to urinary retention Outcome: Progressing   Problem: Pain Managment: Goal:  General experience of comfort will improve and/or be controlled Outcome: Progressing   Problem: Safety: Goal: Ability to remain free from injury will improve Outcome: Progressing   Problem: Skin Integrity: Goal: Risk for impaired skin integrity will decrease Outcome: Progressing   Problem: Education: Goal: Ability to describe self-care measures that may prevent or decrease complications (Diabetes Survival Skills Education) will improve Outcome: Progressing Goal: Individualized Educational Video(s) Outcome: Progressing   Problem: Coping: Goal: Ability to adjust to condition or change in health will improve Outcome: Progressing   Problem: Fluid Volume: Goal: Ability to maintain a balanced intake and output will improve Outcome: Progressing   Problem: Health Behavior/Discharge Planning: Goal: Ability to identify and utilize available resources and services will improve Outcome: Progressing Goal: Ability to manage health-related needs will improve Outcome: Progressing   Problem: Metabolic: Goal: Ability to maintain appropriate glucose levels will improve Outcome: Progressing   Problem: Nutritional: Goal: Maintenance of adequate nutrition will improve Outcome: Progressing Goal: Progress toward achieving an optimal weight will improve Outcome: Progressing   Problem: Skin Integrity: Goal: Risk for impaired skin integrity will decrease Outcome: Progressing   Problem: Tissue Perfusion: Goal: Adequacy of tissue perfusion will improve Outcome: Progressing

## 2024-02-04 NOTE — Discharge Summary (Signed)
 "  Discharge Summary   Patient ID: Jennifer Lozano MRN: [id number]; DOB: [birth year]  Admit date: 02/03/2024 Discharge date: 02/04/2024  PCP:  [place] HERO, MD   [facility] Providers Cardiologist:  Deatrice Cage, MD       Discharge Diagnoses  Principal Problem:   Coronary artery disease of native artery of native heart with stable angina pectoris Active Problems:   Controlled type 2 diabetes mellitus without complication, without long-term current use of insulin  [facility])   Hyperlipidemia   Hypertension   Morbid obesity [facility])   Diagnostic Studies/Procedures  LHC 02/03/2024   RPDA lesion is 30% stenosed.   Prox RCA lesion is 30% stenosed.   Dist RCA lesion is 20% stenosed with 0% stenosed side branch in RPAV.   Prox LAD lesion is 50% stenosed.   Mid LAD lesion is 80% stenosed.   A drug-eluting stent was successfully placed using a STENT ONYX FRONTIER [id number].   Post intervention, there is a 0% residual stenosis.   Post intervention, there is a 0% residual stenosis.   The left ventricular systolic function is normal.   LV end diastolic pressure is normal.   The left ventricular ejection fraction is 55-65% by visual estimate.   In the absence of any other complications or medical issues, we expect the patient to be ready for discharge from an interventional cardiology perspective on 02/04/2024.   Recommend uninterrupted dual antiplatelet therapy with Aspirin  81mg  daily and Ticagrelor  90mg  twice daily for a minimum of 6 months (stable ischemic heart disease-Class I recommendation).   1.  Patent RCA stent with no significant restenosis.  Significant mid LAD stenosis and moderate proximal LAD disease. 2.  Normal LV systolic function normal left ventricular end-diastolic pressure. 3.  Successful OCT guided PCI and drug-eluting stent placement to the proximal/mid LAD.  There was a small proximal edge dissection noted by OCT that was not treated given small size, proximal location  and being superficial.   Recommendations: Will observe overnight with hydration given difficult procedure overall due to tortuous diffuse disease.  Diagnostic Dominance: Right  Intervention   ___________   History of Present Illness   Jennifer Lozano is a 59 y.o. female with a past medical history of type 2 diabetes, mild intermittent asthma, hyperlipidemia, hypertension, coronary artery disease status post previous DES/PCI to the distal RCA, syncope, who was evaluated in the emergency department for chest pain.  She followed up in clinic 01/28/2024 having chest pain again 2 weeks described as heaviness and aching sensation that was happening with exertion and sometimes with rest.  She had significant exertional dyspnea as well as had a syncopal episode the week before trying to pick up a bag of dog food.  She was evaluated in the emergency department on 01/20/2024.  EKG showed no ischemic changes and her troponin was considered normal.  With her symptoms and disease it was recommended that she proceed with an urgent left heart catheterization with possible PCI for concerns of unstable angina.   Hospital Course   Consultants:    She presented to [facility] for an elective left heart catheterization on 02/03/2024 with possible percutaneous coronary artery intervention.  Catheterization revealed a patent RCA stent with no significant restenosis, significant mid LAD stenosis and moderate proximal LAD disease.  Normal LV systolic function with a normal left ventricular end-diastolic pressure shunt with successful. OCT guided PCI with DES placement to the proximal/mid LAD.  There was a small proximal edge dissection noted by OCT that was  not treated given the small, proximal location and being superficial.  Recommendation was observation overnight with gentle hydration given difficult procedure overall due to torturous diffuse disease.  Unfortunately she did suffer from post PCI hypotension, bradycardia and  discomfort to her right groin access area.  There was small hematoma with manual pressure being held.  Her blood pressure and heart rate did drop likely as a vagal response.  She was administered 1 mg of IV atropine  and given IV fluids.  She was then started on a small dose of norepinephrine  with continued manual manual pressure of the right groin for about 20 to 30 minutes.  Stat labs noted a drop in her hemoglobin from 12.9-11.6.  Stat CT of the abdomen was performed which showed no evidence of retroperitoneal bleed.  She continued to remain chest pain-free.  EKG showed no evidence of ischemia and no acute changes were noted.  She was evaluated this morning and her husband was at the bedside.  She denies any chest discomfort, palpitations, chest pressure, or shortness of breath.  She does have some right femoral cath site tenderness.  She has been up ambulating without complaints.  Vital signs have remained stable.  There was a slight drop that was noted in her hemoglobin with a repeat CBC ordered this morning.   Physical Exam Constitutional:      Appearance: Normal appearance. She is obese.  HENT:     Head: Normocephalic and atraumatic.     Mouth/Throat:     Mouth: Mucous membranes are moist.  Eyes:     Pupils: Pupils are equal, round, and reactive to light.  Cardiovascular:     Rate and Rhythm: Normal rate and regular rhythm.     Pulses: Normal pulses.     Heart sounds: Normal heart sounds.     Comments: Right groin cath site with gauze and opsite dressing intact, bruising noted, no bruit, femoral, DP,  and PT pulses on the right 2+ Pulmonary:     Effort: Pulmonary effort is normal.     Breath sounds: Normal breath sounds.  Abdominal:     General: Bowel sounds are normal.  Musculoskeletal:        General: Normal range of motion.     Cervical back: Normal range of motion.  Skin:    General: Skin is warm and dry.  Neurological:     Mental Status: She is alert.        Did the  patient have an acute coronary syndrome (MI, NSTEMI, STEMI, etc) this admission?:  No                               Did the patient have a percutaneous coronary intervention (stent / angioplasty)?:  Yes.     Cath/PCI Registry Performance & Quality Measures: Aspirin  prescribed? - Yes ADP Receptor Inhibitor (Plavix/Clopidogrel, Brilinta /Ticagrelor  or Effient/Prasugrel) prescribed (includes medically managed patients)? - Yes High Intensity Statin (Lipitor 40-80mg  or Crestor 20-40mg ) prescribed? - Yes For EF <40%, was ACEI/ARB prescribed? - Not Applicable (EF >/= 40%) For EF <40%, Aldosterone Antagonist (Spironolactone or Eplerenone) prescribed? - Not Applicable (EF >/= 40%) Cardiac Rehab Phase II ordered? - Yes  _____________  Discharge Vitals Blood pressure (!) 110/43, pulse 68, temperature 98 F (36.7 C), temperature source Oral, resp. rate 19, SpO2 100%.  There were no vitals filed for this visit.  Labs & Radiologic Studies  CBC Recent Labs    02/03/24  1237 02/04/24 0241 02/04/24 0841  WBC 10.2 9.5 10.3  NEUTROABS 6.7  --   --   HGB 11.6* 10.4* 11.1*  HCT 35.5* 31.1* 32.5*  MCV 92.4 90.4 89.8  PLT 264 218 221   Basic Metabolic Panel Recent Labs    98/73/73 1237 02/04/24 0241  NA 137 138  K 4.4 4.2  CL 104 105  CO2 24 25  GLUCOSE 151* 189*  BUN 19 21*  CREATININE 1.09* 1.22*  CALCIUM  8.4* 8.6*   Liver Function Tests No results for input(s): AST, ALT, ALKPHOS, BILITOT, PROT, ALBUMIN in the last 72 hours. No results for input(s): LIPASE, AMYLASE in the last 72 hours. High Sensitivity Troponin:   No results for input(s): TROPONINIHS in the last 720 hours.  Recent Labs  Lab 01/20/24 0835  TRNPT 18    BNP Invalid input(s): POCBNP No results for input(s): PROBNP in the last 72 hours.  No results for input(s): BNP in the last 72 hours.  D-Dimer No results for input(s): DDIMER in the last 72 hours. Hemoglobin A1C No results for input(s):  HGBA1C in the last 72 hours. Fasting Lipid Panel No results for input(s): CHOL, HDL, LDLCALC, TRIG, CHOLHDL, LDLDIRECT in the last 72 hours. No results found for: LIPOA  Thyroid Function Tests No results for input(s): TSH, T4TOTAL, T3FREE, THYROIDAB in the last 72 hours.  Invalid input(s): FREET3 _____________  CT ABDOMEN PELVIS WO CONTRAST Result Date: 02/03/2024 CLINICAL DATA:  Postop abdominal pain. EXAM: CT ABDOMEN AND PELVIS WITHOUT CONTRAST TECHNIQUE: Multidetector CT imaging of the abdomen and pelvis was performed following the standard protocol without IV contrast. RADIATION DOSE REDUCTION: This exam was performed according to the departmental dose-optimization program which includes automated exposure control, adjustment of the mA and/or kV according to patient size and/or use of iterative reconstruction technique. COMPARISON:  08/31/2022. FINDINGS: Lower chest: Lung bases are clear. No pleural effusion. Distal esophagus is grossly unremarkable. Hepatobiliary: Liver is grossly unremarkable. Cholecystectomy. No biliary ductal dilatation. Pancreas: Negative. Spleen: Negative. Adrenals/Urinary Tract: Adrenal glands and kidneys are unremarkable. Ureters are decompressed. Bladder is grossly unremarkable. Stomach/Bowel: Tiny hiatal hernia. Stomach, small bowel, appendix and colon are unremarkable. Vascular/Lymphatic: Fairly extensive high-density stranding along the right external iliac chain, right inguinal region and ventral upper right thigh, presumably related to recent catheterization. Atherosclerotic calcification of the aorta. Reproductive: Hysterectomy.  No adnexal mass. Other: No free fluid. Mesenteries and peritoneum are otherwise unremarkable. Musculoskeletal: Degenerative changes in the spine. IMPRESSION: 1. Hemorrhagic stranding along the right external iliac chain, right groin and upper right thigh, compatible with recent catheterization. 2. No additional findings  to explain the patient's pain. 3.  Aortic atherosclerosis (ICD10-I70.0). Electronically Signed   By: Newell Eke M.D.   On: 02/03/2024 13:06   CARDIAC CATHETERIZATION Result Date: 02/03/2024   RPDA lesion is 30% stenosed.   Prox RCA lesion is 30% stenosed.   Dist RCA lesion is 20% stenosed with 0% stenosed side branch in RPAV.   Prox LAD lesion is 50% stenosed.   Mid LAD lesion is 80% stenosed.   A drug-eluting stent was successfully placed using a STENT ONYX FRONTIER [id number].   Post intervention, there is a 0% residual stenosis.   Post intervention, there is a 0% residual stenosis.   The left ventricular systolic function is normal.   LV end diastolic pressure is normal.   The left ventricular ejection fraction is 55-65% by visual estimate.   In the absence of any other complications or medical issues,  we expect the patient to be ready for discharge from an interventional cardiology perspective on 02/04/2024.   Recommend uninterrupted dual antiplatelet therapy with Aspirin  81mg  daily and Ticagrelor  90mg  twice daily for a minimum of 6 months (stable ischemic heart disease-Class I recommendation). 1.  Patent RCA stent with no significant restenosis.  Significant mid LAD stenosis and moderate proximal LAD disease. 2.  Normal LV systolic function normal left ventricular end-diastolic pressure. 3.  Successful OCT guided PCI and drug-eluting stent placement to the proximal/mid LAD.  There was a small proximal edge dissection noted by OCT that was not treated given small size, proximal location and being superficial. Recommendations: Will observe overnight with hydration given difficult procedure overall due to tortuous diffuse disease.   DG Chest 2 View Result Date: 01/20/2024 CLINICAL DATA:  Chest pain. EXAM: CHEST - 2 VIEW COMPARISON:  10/08/2023. FINDINGS: The heart size and mediastinal contours are unchanged. Aortic atherosclerosis. No overt pulmonary edema. No focal consolidation, pleural effusion, or  pneumothorax. No acute osseous abnormality. IMPRESSION: No acute cardiopulmonary findings. Electronically Signed   By: Harrietta Sherry M.D.   On: 01/20/2024 10:18    Disposition Pt is being discharged home today in good condition.  Follow-up Plans & Appointments  Discharge Instructions     AMB Referral to Cardiac Rehabilitation - Phase II   Complete by: As directed    Diagnosis: Coronary Stents   After initial evaluation and assessments completed: Virtual Based Care may be provided alone or in conjunction with Phase 2 Cardiac Rehab based on patient barriers.: Yes   Intensive Cardiac Rehabilitation (ICR) [facility] location only OR Traditional Cardiac Rehabilitation (TCR) *If criteria for ICR are not met will enroll in TCR [facility] only): Yes       Discharge Medications Allergies as of 02/04/2024       Reactions   Morphine Palpitations        Medication List     TAKE these medications    albuterol 108 (90 Base) MCG/ACT inhaler Commonly known as: VENTOLIN HFA Inhale 2 puffs into the lungs.   amLODipine  5 MG tablet Commonly known as: NORVASC  Take 5 mg by mouth daily.   aspirin  EC 81 MG tablet Take 81 mg by mouth daily.   atorvastatin  40 MG tablet Commonly known as: LIPITOR Take 40 mg by mouth daily.   Dexcom G7 Sensor Misc 1 each.   insulin  lispro 100 UNIT/ML KwikPen Commonly known as: HUMALOG  Inject 10-15 Units into the skin 3 (three) times daily. Adjust  according to carb intake   Lantus  SoloStar 100 UNIT/ML Solostar Pen Generic drug: insulin  glargine Inject 40 Units into the skin at bedtime.   Levothyroxine  Sodium 112 MCG Caps Take 112 mcg by mouth daily before breakfast.   lisinopril -hydrochlorothiazide  20-25 MG tablet Commonly known as: ZESTORETIC  Take 1 tablet by mouth daily.   metoprolol  succinate 25 MG 24 hr tablet Commonly known as: TOPROL -XL Take 0.5 tablets (12.5 mg total) by mouth daily. Take with or immediately following a meal.   ondansetron  4 MG  disintegrating tablet Commonly known as: ZOFRAN -ODT Take 1 tablet (4 mg total) by mouth every 8 (eight) hours as needed for nausea or vomiting.   pantoprazole  40 MG tablet Commonly known as: PROTONIX  Take 40 mg by mouth daily.   pregabalin  75 MG capsule Commonly known as: LYRICA  Take 75 mg by mouth daily.   ticagrelor  90 MG Tabs tablet Commonly known as: BRILINTA  Take 1 tablet (90 mg total) by mouth 2 (two) times daily.  Trulicity 4.5 MG/0.5ML Soaj Generic drug: Dulaglutide Inject 4.5 mg into the skin once a week.         Outstanding Labs/Studies   Duration of Discharge Encounter: APP Time: 25 minutes   Signed, Sulma Ruffino, NP 02/04/2024, 4:04 PM     "

## 2024-02-05 LAB — LIPOPROTEIN A (LPA): Lipoprotein (a): 34.6 nmol/L — ABNORMAL HIGH

## 2024-02-12 ENCOUNTER — Encounter: Payer: Self-pay | Admitting: Medical

## 2024-02-12 ENCOUNTER — Ambulatory Visit: Admitting: Medical

## 2024-02-12 VITALS — BP 106/70 | HR 74 | Ht 62.5 in | Wt 222.0 lb

## 2024-02-12 DIAGNOSIS — I25110 Atherosclerotic heart disease of native coronary artery with unstable angina pectoris: Secondary | ICD-10-CM

## 2024-02-12 DIAGNOSIS — Z79899 Other long term (current) drug therapy: Secondary | ICD-10-CM

## 2024-02-12 DIAGNOSIS — R55 Syncope and collapse: Secondary | ICD-10-CM | POA: Diagnosis not present

## 2024-02-12 DIAGNOSIS — I10 Essential (primary) hypertension: Secondary | ICD-10-CM | POA: Diagnosis not present

## 2024-02-12 DIAGNOSIS — E785 Hyperlipidemia, unspecified: Secondary | ICD-10-CM | POA: Diagnosis not present

## 2024-02-12 MED ORDER — NITROGLYCERIN 0.4 MG SL SUBL: 0.4000 mg | SUBLINGUAL_TABLET | SUBLINGUAL | 3 refills | Status: AC | PRN

## 2024-02-12 NOTE — Progress Notes (Signed)
 " Cardiology Office Note   Date:  02/12/2024  ID:  Jennifer Lozano, DOB 12-10-1965, MRN [id number] PCP: [place] HERO, MD  [facility] Providers Cardiologist:  Deatrice Cage, MD   History of Present Illness Jennifer Lozano is a 59 y.o. female with a history of diabetes, asthma, HLD, hypertension, CAD s/p DES/PCI dRCA and p-mLAD , and syncope who presents for follow-up for CAD and sycnope.   Patient was seen in the ER 07/20/2023 with syncope.  Patient had vomiting, nausea, diarrhea for 3 days. She reports she passed out 3 times at home.  Vitals were stable.  Labs showed serum creatinine 1.27, BUN 28, WBC 12.5.  EKG showed normal sinus rhythm, 87 bpm.  She was given IV fluids, antinausea medication, PPI.  She was discharged with Pepcid  and Zofran . Carotid ultrasound showed no significant disease.  Subsequent heart monitor showed predominantly normal sinus rhythm with an average heart rate of 69 bpm, paroxysmal Afib (SVT labeled incorrectly as SVT), 1 run of NSVT lasting 12 beats, 47 runs of SVT lasting up to 6.2 seconds.  She was started on Toprol  12.5 mg daily.  Echo and cardiac CTA were ordered.  Coronary CTA on 09/27/2023 showed a calcium  score of 248 which was 95th percentile, severe CAD disease involving the proximal LAD, proximal to mid RCA, and proximal PAD estimated at 70 to 99% stenosis.  CT FFR showed significant stenosis in the proximal LAD and proximal to mid RCA as outlined below with recommendations for cardiac cath.  After she underwent coronary CT the patient developed pinpoint sharp split-second lasting episodes of chest discomfort while driving home with associated nausea and she was admitted on 09/27/2023 with unstable angina.  High-sensitivity troponin was negative x 4.  Cardiac cath showed significant two-vessel CAD, normal LVSF with mildly elevated LVEDP.  The patient was treated with successful complex bifurcation angioplasty and drug-eluting stent placement using provisional stent  technique.  The right PDA was ballooned without stent placement.  The stent at the distal RCA extending into the right AV groove.  The patient was started on DAPT with aspirin  and ticagrelor  for at least 6 months.  Can consider staged LAD PCI as an outpatient but might have to consider alternative access given severe right arm discomfort difficulty torquing the catheter due to small radial artery size and suspected spasm.  Echo showed EF 55 to 60%  The patient was seen 11/12/23 reporting cath procedure was very uncomfortable and unsure she wanted to pursue the staged PCI, so it was recommended she follow-up with MD to discuss procedure. She was seen in the office 01/28/24 by Dr. Cage for chest pain, who recommended repeat cath.She presented to [facility] for an elective left heart catheterization on 02/03/2024 with possible percutaneous coronary artery intervention.  Catheterization revealed a patent RCA stent with no significant restenosis, significant mid LAD stenosis and moderate proximal LAD disease.  Normal LV systolic function with a normal left ventricular end-diastolic pressure shunt with successful. OCT guided PCI with DES placement to the proximal/mid LAD.  There was a small proximal edge dissection noted by OCT that was not treated given the small, proximal location and being superficial.  Recommendation was observation overnight with gentle hydration given difficult procedure overall due to torturous diffuse disease.  Unfortunately she did suffer from post PCI hypotension, bradycardia and discomfort to her right groin access area.  There was small hematoma with manual pressure being held.  Her blood pressure and heart rate did drop likely as a  vagal response.  She was administered 1 mg of IV atropine  and given IV fluids.  She was then started on a small dose of norepinephrine  with continued manual manual pressure of the right groin for about 20 to 30 minutes.  Stat labs noted a drop in her hemoglobin from  12.9-11.6.  Stat CT of the abdomen was performed which showed no evidence of retroperitoneal bleed.  She continued to remain chest pain-free.  EKG showed no evidence of ischemia and no acute changes were noted.   Today, the patient is overall doing well. She reports minor soreness in the right groin site. She denies chest pain, SOB, LLE, palpitations. She reports compliance with DAPT. She is walking slowly due to groin site discomfot. She is not interested in cardiac rehab. She denies further syncope.   Studies Reviewed EKG Interpretation Date/Time:  Wednesday February 12 2024 08:14:22 EST Ventricular Rate:  74 PR Interval:  154 QRS Duration:  70 QT Interval:  396 QTC Calculation: 439 R Axis:   2  Text Interpretation: Normal sinus rhythm Normal ECG When compared with ECG of 04-Feb-2024 06:05, No significant change was found Confirmed by Franchester, Chloe Baig ([id number]) on 02/12/2024 8:17:18 AM    LHC 02/03/2024   RPDA lesion is 30% stenosed.   Prox RCA lesion is 30% stenosed.   Dist RCA lesion is 20% stenosed with 0% stenosed side branch in RPAV.   Prox LAD lesion is 50% stenosed.   Mid LAD lesion is 80% stenosed.   A drug-eluting stent was successfully placed using a STENT ONYX FRONTIER [id number].   Post intervention, there is a 0% residual stenosis.   Post intervention, there is a 0% residual stenosis.   The left ventricular systolic function is normal.   LV end diastolic pressure is normal.   The left ventricular ejection fraction is 55-65% by visual estimate.   In the absence of any other complications or medical issues, we expect the patient to be ready for discharge from an interventional cardiology perspective on 02/04/2024.   Recommend uninterrupted dual antiplatelet therapy with Aspirin  81mg  daily and Ticagrelor  90mg  twice daily for a minimum of 6 months (stable ischemic heart disease-Class I recommendation).   1.  Patent RCA stent with no significant restenosis.  Significant mid LAD stenosis  and moderate proximal LAD disease. 2.  Normal LV systolic function normal left ventricular end-diastolic pressure. 3.  Successful OCT guided PCI and drug-eluting stent placement to the proximal/mid LAD.  There was a small proximal edge dissection noted by OCT that was not treated given small size, proximal location and being superficial.   Recommendations: Will observe overnight with hydration given difficult procedure overall due to tortuous diffuse disease.   Diagnostic Dominance: Right  Intervention     LHC 09/2023 1.  Significant two-vessel coronary artery disease. 2.  Normal LV systolic function mildly elevated left ventricular end-diastolic pressure at 18 mmHg. 3.  Successful complex bifurcation angioplasty and drug-eluting stent placement using provisional stenting technique.  The right PDA was ballooned without stent placement.  The stent at the distal RCA extended into the right AV groove.   Recommendations: Dual antiplatelet therapy for at least 6 months. Aggressive treatment of risk factors. Will consider staged LAD PCI as an outpatient but might have to consider alternative access given severe right arm discomfort and difficulty torquing the catheter due to small radial artery size and suspected spasm. Antiplatelet/Anticoag Recommend uninterrupted dual antiplatelet therapy with Aspirin  81mg  daily and Ticagrelor  90mg  twice daily for a  minimum of 6 months (stable ischemic heart disease-Class I recommendation).   Echo 09/2023 1. Left ventricular ejection fraction, by estimation, is 55 to 60%. The  left ventricle has normal function. The left ventricle has no regional  wall motion abnormalities. Left ventricular diastolic parameters were  normal.   2. Right ventricular systolic function is normal. The right ventricular  size is normal. Tricuspid regurgitation signal is inadequate for assessing  PA pressure.   3. The mitral valve is normal in structure. No evidence of mitral  valve  regurgitation. No evidence of mitral stenosis.   4. The aortic valve is normal in structure. Aortic valve regurgitation is  not visualized. Aortic valve sclerosis is present, with no evidence of  aortic valve stenosis.    Heart monitor 09/2023 Conclusion Average heart rate 66, range 49-169 bpm. 17 episodes of nonsustained SVT, longest lasting 20 beats, fastest 7 beats. No atrial fibrillation or atrial flutter. No sustained arrhythmias.   Heart monitor 09/2023 Conclusion Average heart rate 69, range 51-2 11. Paroxysmal atrial fibrillation noted ( strip 7, episodes of A-fib was incorrectly labeled as SVT). 1 run of nonsustained VT lasting 12 beats. Episodes of nonsustained SVT, longest lasting 16 seconds.       Physical Exam VS:  BP 106/70 (BP Location: Left Arm, Patient Position: Sitting, Cuff Size: Large)   Pulse 74   Ht 5' 2.5 (1.588 m)   Wt 222 lb (100.7 kg)   SpO2 99%   BMI 39.96 kg/m        Wt Readings from Last 3 Encounters:  02/12/24 222 lb (100.7 kg)  01/28/24 225 lb 6 oz (102.2 kg)  01/20/24 219 lb (99.3 kg)    GEN: Well nourished, well developed in no acute distress NECK: No JVD; No carotid bruits CARDIAC: RRR, no murmurs, rubs, gallops RESPIRATORY:  Clear to auscultation without rales, wheezing or rhonchi  ABDOMEN: Soft, non-tender, non-distended EXTREMITIES:  No edema; No deformity   ASSESSMENT AND PLAN  CAD She is s/p distal RCA 09/30/23. The patient was started on DAPT with aspirin  and ticagrelor  for at least 6 months. She was brought back for staged PCI of the LAD. Cath 02/03/24 showed patent RCA stent with significant mid LAD stenosis and moderate proximal AT stenosis treated with PCI and stent placement.  Post PCI patient had hypotension and bradycardia treated with IV atropine , IV fluids, small dose of norepinephrine  and pressure on the groin site with improvement.  A CT of the abdomen showed known bleeding.  She was admitted overnight for  observation.  Follow-up labs were stable.  She was started on DAPT with aspirin  1 mg daily and ticagrelor  90 mg twice daily for 6 months.  Since discharge she has been stable from a cardiac perspective.  She denies chest pain or shortness of breath.  Right groin site with minimal bruising.  Patient does not want to do cardiac rehab.  Recommended patient initiate regular walking program as able.  CBC today.  Continue aspirin , Brilinta , Lipitor, Toprol .  Syncope She had syncope post-PCI suspected vasovagal as described above. She denies further syncope.  Prior heart monitor unremarkable.  We will continue to monitor symptoms.  Possible Afib Per MD, heart monitor showed no Afib or Aflutter.   HLD LDL 55. Continue 40mg  daily.   HTN Blood pressure soft today. She denies dizziness or lightheadedness. Continue amlodipine  5mg  daily, lisinopril -hydrochlorothiazide  20-25mg  daily, and Toprol  12.5mg  daily.      Cardiac Rehabilitation Eligibility Assessment  Dispo: Follow-up in 3 months  Signed, Brenner Visconti VEAR Fishman, PA-C   "

## 2024-02-12 NOTE — Patient Instructions (Signed)
 Medication Instructions:   Your physician recommends the following medication changes.  START TAKING: Nitroglycerin  0.4 mg tablets as needed   Vasodilator: Nitroglycerin  is a potent vasodilator, relaxing blood vessels and improving blood flow to the heart.  Treatment for Angina: It's used to treat and prevent angina (chest pain) caused by coronary artery disease.  Take one tablet for chest pain. Wait 5 minutes. If the pain has not subsided, take another tablet. Wait 5 minutes. If the pain has not subsided, take third tablet and, call 911 or go to the emergency room.   Potential Side Effects: Common side effects of nitroglycerin  in medical applications include headache, dizziness, and low blood pressure.  Handling & Storage: Due to its explosive nature, nitroglycerin  requires careful handling and storage, away from heat, shock, and incompatible materials.  Drug Interactions: Nitroglycerin  can interact with other medications, such as certain drugs used for erectile dysfunction, potentially leading to dangerous drops in blood pressure.   *If you need a refill on your cardiac medications before your next appointment, please call your pharmacy*  Lab Work:  Your provider would like for you to have following labs drawn today CBC.    If you have labs (blood work) drawn today and your tests are completely normal, you will receive your results only by:  MyChart Message (if you have MyChart) OR  A paper copy in the mail If you have any lab test that is abnormal or we need to change your treatment, we will call you to review the results.  Testing/Procedures:  None ordered at this time   Referrals:  None ordered at this time   Follow-Up:  At [facility], you and your health needs are our priority.  As part of our continuing mission to provide you with exceptional heart care, our providers are all part of one team.  This team includes your primary Cardiologist (physician) and  Advanced Practice Providers or APPs (Physician Assistants and Nurse Practitioners) who all work together to provide you with the care you need, when you need it.  Your next appointment:   3 month(s)  Provider:    Deatrice Cage, MD or Cadence Franchester, [place]    We recommend signing up for the patient portal called MyChart.  Sign up information is provided on this After Visit Summary.  MyChart is used to connect with patients for Virtual Visits (Telemedicine).  Patients are able to view lab/test results, encounter notes, upcoming appointments, etc.  Non-urgent messages can be sent to your provider as well.   To learn more about what you can do with MyChart, go to [email or web address].

## 2024-02-13 ENCOUNTER — Ambulatory Visit: Payer: Self-pay | Admitting: Medical

## 2024-02-13 LAB — CBC
Hematocrit: 35.1 % (ref 34.0–46.6)
Hemoglobin: 11.7 g/dL (ref 11.1–15.9)
MCH: 31.4 pg (ref 26.6–33.0)
MCHC: 33.3 g/dL (ref 31.5–35.7)
MCV: 94 fL (ref 79–97)
Platelets: 330 10*3/uL (ref 150–450)
RBC: 3.73 x10E6/uL — ABNORMAL LOW (ref 3.77–5.28)
RDW: 12.2 % (ref 11.7–15.4)
WBC: 9.2 10*3/uL (ref 3.4–10.8)

## 2024-02-14 ENCOUNTER — Ambulatory Visit: Admitting: Cardiovascular Disease

## 2024-03-05 ENCOUNTER — Ambulatory Visit: Admitting: Medical

## 2024-05-19 ENCOUNTER — Ambulatory Visit: Admitting: Medical
